# Patient Record
Sex: Female | Born: 1970 | Race: White | Hispanic: No | Marital: Married | Smoking: Never smoker
Health system: Southern US, Community
[De-identification: ages and names within clinical notes are randomized; demographics above are authoritative.]

## PROBLEM LIST (undated history)

## (undated) DIAGNOSIS — C4490 Unspecified malignant neoplasm of skin, unspecified: Secondary | ICD-10-CM

## (undated) DIAGNOSIS — Z9109 Other allergy status, other than to drugs and biological substances: Secondary | ICD-10-CM

## (undated) HISTORY — DX: Unspecified malignant neoplasm of skin, unspecified: C44.90

## (undated) HISTORY — DX: Other allergy status, other than to drugs and biological substances: Z91.09

## (undated) HISTORY — PX: TUBAL LIGATION: SHX77

## (undated) HISTORY — PX: ABDOMINAL HYSTERECTOMY: SHX81

---

## 2006-01-22 ENCOUNTER — Emergency Department: Payer: Self-pay | Admitting: Emergency Medicine

## 2009-12-17 ENCOUNTER — Ambulatory Visit: Payer: Self-pay | Admitting: Family Medicine

## 2011-05-31 ENCOUNTER — Ambulatory Visit: Payer: Self-pay | Admitting: Family Medicine

## 2012-08-20 ENCOUNTER — Emergency Department: Payer: Self-pay | Admitting: Emergency Medicine

## 2012-08-22 ENCOUNTER — Ambulatory Visit: Payer: Self-pay | Admitting: Family Medicine

## 2012-12-25 ENCOUNTER — Encounter: Payer: Self-pay | Admitting: *Deleted

## 2013-01-03 ENCOUNTER — Encounter: Payer: Self-pay | Admitting: Obstetrics & Gynecology

## 2013-01-24 ENCOUNTER — Encounter: Payer: Self-pay | Admitting: Obstetrics & Gynecology

## 2013-01-24 ENCOUNTER — Ambulatory Visit (INDEPENDENT_AMBULATORY_CARE_PROVIDER_SITE_OTHER): Payer: BC Managed Care – PPO | Admitting: Obstetrics & Gynecology

## 2013-01-24 VITALS — BP 120/70 | Ht 62.0 in | Wt 197.0 lb

## 2013-01-24 DIAGNOSIS — N920 Excessive and frequent menstruation with regular cycle: Secondary | ICD-10-CM | POA: Insufficient documentation

## 2013-01-24 DIAGNOSIS — N946 Dysmenorrhea, unspecified: Secondary | ICD-10-CM

## 2013-01-24 MED ORDER — MEGESTROL ACETATE 40 MG PO TABS: 40.0000 mg | ORAL_TABLET | Freq: Every day | ORAL | Status: DC

## 2013-01-24 NOTE — Patient Instructions (Signed)
Endometrial Ablation Endometrial ablation removes the lining of the uterus (endometrium). It is usually a same day, outpatient treatment. Ablation helps avoid major surgery (such as a hysterectomy). A hysterectomy is removal of the cervix and uterus. Endometrial ablation has less risk and complications, has a shorter recovery period and is less expensive. After endometrial ablation, most women will have little or no menstrual bleeding. You may not keep your fertility. Pregnancy is no longer likely after this procedure but if you are pre-menopausal, you still need to use a reliable method of birth control following the procedure because pregnancy can occur. REASONS TO HAVE THE PROCEDURE MAY INCLUDE:  Heavy periods.  Bleeding that is causing anemia.  Anovulatory bleeding, very irregular, bleeding.  Bleeding submucous fibroids (on the lining inside the uterus) if they are smaller than 3 centimeters. REASONS NOT TO HAVE THE PROCEDURE MAY INCLUDE:  You wish to have more children.  You have a pre-cancerous or cancerous problem. The cause of any abnormal bleeding must be diagnosed before having the procedure.  You have pain coming from the uterus.  You have a submucus fibroid larger than 3 centimeters.  You recently had a baby.  You recently had an infection in the uterus.  You have a severe retro-flexed, tipped uterus and cannot insert the instrument to do the ablation.  You had a Cesarean section or deep major surgery on the uterus.  The inner cavity of the uterus is too large for the endometrial ablation instrument. RISKS AND COMPLICATIONS   Perforation of the uterus.  Bleeding.  Infection of the uterus, bladder or vagina.  Injury to surrounding organs.  Cutting the cervix.  An air bubble to the lung (air embolus).  Pregnancy following the procedure.  Failure of the procedure to help the problem requiring hysterectomy.  Decreased ability to diagnose cancer in the lining of  the uterus. BEFORE THE PROCEDURE  The lining of the uterus must be tested to make sure there is no pre-cancerous or cancer cells present.  Medications may be given to make the lining of the uterus thinner.  Ultrasound may be used to evaluate the size and look for abnormalities of the uterus.  Future pregnancy is not desired. PROCEDURE  There are different ways to destroy the lining of the uterus.   Resectoscope - radio frequency-alternating electric current is the most common one used.  Cryotherapy - freezing the lining of the uterus.  Heated Free Liquid - heated salt (saline) solution inserted into the uterus.  Microwave - uses high energy microwaves in the uterus.  Thermal Balloon - a catheter with a balloon tip is inserted into the uterus and filled with heated fluid. Your caregiver will talk with you about the method used in this clinic. They will also instruct you on the pros and cons of the procedure. Endometrial ablation is performed along with a procedure called operative hysteroscopy. A narrow viewing tube is inserted through the birth canal (vagina) and through the cervix into the uterus. A tiny camera attached to the viewing tube (hysteroscope) allows the uterine cavity to be shown on a TV monitor during surgery. Your uterus is filled with a harmless liquid to make the procedure easier. The lining of the uterus is then removed. The lining can also be removed with a resectoscope which allows your surgeon to cut away the lining of the uterus under direct vision. Usually, you will be able to go home within an hour after the procedure. HOME CARE INSTRUCTIONS   Do   not drive for 24 hours.  No tampons, douching or intercourse for 2 weeks or until your caregiver approves.  Rest at home for 24 to 48 hours. You may then resume normal activities unless told differently by your caregiver.  Take your temperature two times a day for 4 days, and record it.  Take any medications your  caregiver has ordered, as directed.  Use some form of contraception if you are pre-menopausal and do not want to get pregnant. Bleeding after the procedure is normal. It varies from light spotting and mildly watery to bloody discharge for 4 to 6 weeks. You may also have mild cramping. Only take over-the-counter or prescription medicines for pain, discomfort, or fever as directed by your caregiver. Do not use aspirin, as this may aggravate bleeding. Frequent urination during the first 24 hours is normal. You will not know how effective your surgery is until at least 3 months after the surgery. SEEK IMMEDIATE MEDICAL CARE IF:   Bleeding is heavier than a normal menstrual cycle.  An oral temperature above 102 F (38.9 C) develops.  You have increasing cramps or pains not relieved with medication or develop belly (abdominal) pain which does not seem to be related to the same area of earlier cramping and pain.  You are light headed, weak or have fainting episodes.  You develop pain in the shoulder strap areas.  You have chest or leg pain.  You have abnormal vaginal discharge.  You have painful urination. Document Released: 04/08/2004 Document Revised: 08/22/2011 Document Reviewed: 07/07/2007 [employer] Patient Information 2014 [employer].  

## 2013-01-24 NOTE — Progress Notes (Signed)
Patient ID: Frances Weber, female   DOB: 1970-08-23, 42 y.o.   MRN: [id number] Farrell is referred for evaluation of small cystic areas on the outside of the vulva small cystic areas in the outside of the vulva On exam they are very small sebaceous cyst of the absolutely no clinical significance  Para the patient does relate that she has extremely heavy and crampy menstrual periods with a lot of cramping and large clotting the first 3 days of her period and she would like that addressed  I told her that we'll put her in May see her back in about 5 weeks or so and do a sonogram to evaluate the response of her endometrium and her bleeding since then and probably proceed with endometrial ablation She is given information regarding that and will see her back in about 5-6 weeks

## 2013-03-07 ENCOUNTER — Ambulatory Visit (INDEPENDENT_AMBULATORY_CARE_PROVIDER_SITE_OTHER): Payer: BC Managed Care – PPO

## 2013-03-07 ENCOUNTER — Encounter: Payer: Self-pay | Admitting: Obstetrics & Gynecology

## 2013-03-07 ENCOUNTER — Ambulatory Visit (INDEPENDENT_AMBULATORY_CARE_PROVIDER_SITE_OTHER): Payer: BC Managed Care – PPO | Admitting: Obstetrics & Gynecology

## 2013-03-07 VITALS — BP 130/84 | Ht 63.0 in | Wt 189.5 lb

## 2013-03-07 DIAGNOSIS — N920 Excessive and frequent menstruation with regular cycle: Secondary | ICD-10-CM

## 2013-03-07 DIAGNOSIS — N946 Dysmenorrhea, unspecified: Secondary | ICD-10-CM

## 2013-03-07 MED ORDER — MEGESTROL ACETATE 40 MG PO TABS: 40.0000 mg | ORAL_TABLET | Freq: Every day | ORAL | Status: DC

## 2013-03-07 NOTE — Progress Notes (Signed)
Patient ID: Frances Weber, female   DOB: December 07, 1970, 42 y.o.   MRN: [id number] Preoperative History and Physical  [name] is a 42 y.o. G3P3 with Patient's last menstrual period was 01/06/2013. admitted for a heavy vaginal bleeding and painful periods.  Sonogrm is normal and positive response to megestrol, suitable candidate for megestrol  PMH:    Past Medical History  Diagnosis Date  . Skin cancer     PSH:     Past Surgical History  Procedure Laterality Date  . Tubal ligation      POb/GynH:      OB History   Grav Para Term Preterm Abortions TAB SAB Ect Mult Living   3 3        3       SH:   History  Substance Use Topics  . Smoking status: Never Smoker   . Smokeless tobacco: Never Used  . Alcohol Use: Yes     Comment: occ    FH:    Family History  Problem Relation Age of Onset  . Diabetes Mother   . Hyperlipidemia Mother   . Cancer Mother   . Diabetes Father   . Hypertension Father      Allergies: No Known Allergies  Medications:      Current outpatient prescriptions:megestrol (MEGACE) 40 MG tablet, Take 1 tablet (40 mg total) by mouth daily., Disp: 30 tablet, Rfl: 2;  ferrous sulfate 325 (65 FE) MG tablet, Take 325 mg by mouth daily with breakfast., Disp: , Rfl: ;  fluticasone (FLONASE) 50 MCG/ACT nasal spray, Place 1 spray into the nose daily., Disp: , Rfl:  guaiFENesin-codeine (CHERATUSSIN AC) 100-10 MG/5ML syrup, Take 5 mLs by mouth 3 (three) times daily as needed for cough., Disp: , Rfl: ;  loratadine (CLARITIN) 10 MG tablet, Take 10 mg by mouth daily., Disp: , Rfl:   Review of Systems:   Review of Systems  Constitutional: Negative for fever, chills, weight loss, malaise/fatigue and diaphoresis.  HENT: Negative for hearing loss, ear pain, nosebleeds, congestion, sore throat, neck pain, tinnitus and ear discharge.   Eyes: Negative for blurred vision, double vision, photophobia, pain, discharge and redness.  Respiratory: Negative for cough, hemoptysis,  sputum production, shortness of breath, wheezing and stridor.   Cardiovascular: Negative for chest pain, palpitations, orthopnea, claudication, leg swelling and PND.  Gastrointestinal: Positive for abdominal pain. Negative for heartburn, nausea, vomiting, diarrhea, constipation, blood in stool and melena.  Genitourinary: Negative for dysuria, urgency, frequency, hematuria and flank pain.  Musculoskeletal: Negative for myalgias, back pain, joint pain and falls.  Skin: Negative for itching and rash.  Neurological: Negative for dizziness, tingling, tremors, sensory change, speech change, focal weakness, seizures, loss of consciousness, weakness and headaches.  Endo/Heme/Allergies: Negative for environmental allergies and polydipsia. Does not bruise/bleed easily.  Psychiatric/Behavioral: Negative for depression, suicidal ideas, hallucinations, memory loss and substance abuse. The patient is not nervous/anxious and does not have insomnia.      PHYSICAL EXAM:  Blood pressure 130/84, height 5\' 3"  (1.6 m), weight 189 lb 8 oz (85.957 kg), last menstrual period 01/06/2013.    Vitals reviewed. Constitutional: She is oriented to person, place, and time. She appears well-developed and well-nourished.  HENT:  Head: Normocephalic and atraumatic.  Right Ear: External ear normal.  Left Ear: External ear normal.  Nose: Nose normal.  Mouth/Throat: Oropharynx is clear and moist.  Eyes: Conjunctivae and EOM are normal. Pupils are equal, round, and reactive to light. Right eye exhibits no discharge. Left  eye exhibits no discharge. No scleral icterus.  Neck: Normal range of motion. Neck supple. No tracheal deviation present. No thyromegaly present.  Cardiovascular: Normal rate, regular rhythm, normal heart sounds and intact distal pulses.  Exam reveals no gallop and no friction rub.   No murmur heard. Respiratory: Effort normal and breath sounds normal. No respiratory distress. She has no wheezes. She has no  rales. She exhibits no tenderness.  GI: Soft. Bowel sounds are normal. She exhibits no distension and no mass. There is tenderness. There is no rebound and no guarding.  Genitourinary:       Vulva is normal without lesions Vagina is pink moist without discharge Cervix normal in appearance and pap is normal Uterus is normal by sonogram Adnexa is negative with normal sized ovaries by sonogram  Musculoskeletal: Normal range of motion. She exhibits no edema and no tenderness.  Neurological: She is alert and oriented to person, place, and time. She has normal reflexes. She displays normal reflexes. No cranial nerve deficit. She exhibits normal muscle tone. Coordination normal.  Skin: Skin is warm and dry. No rash noted. No erythema. No pallor.  Psychiatric: She has a normal mood and affect. Her behavior is normal. Judgment and thought content normal.    Labs: No results found for this or any previous visit (from the past 336 hour(s)).  EKG: No orders found for this or any previous visit.  Imaging Studies: US Transvaginal Non-ob  04-03-2013   GYNECOLOGIC SONOGRAM   [name] is a 42 y.o. G3P3 LMP 01/06/2013 for a pelvic sonogram for  menorrhagia and pt is currently on Megace to control bleeding.  Uterus                      9.2 x 6.5 x 5.2 cm, anteverted  Endometrium          17.3 mm, symmetrical,   Right ovary             2.5 x 1.6 x 1.5 cm,   Left ovary                2.7 x 1.5 x 1.2 cm,   No free fluid or adnexal masses noted within pelvis  Technician Comments:  Endometrium=17.74mm, symmetrical   Chari Weber 04-03-13 3:50 PM  Normal sonogram with expected decidual reaction secondary to megace. Appropriate for ablation  Lazaro Arms April 03, 2013 4:15 PM        Assessment: Patient Active Problem List   Diagnosis Date Noted  . Excessive or frequent menstruation 01/24/2013  . Dysmenorrhea 01/24/2013    Plan: Hysteroscopy uterine curettage endometrial ablation  Mazy Culton  H 04/03/13 4:22 PM

## 2013-03-26 ENCOUNTER — Encounter (HOSPITAL_COMMUNITY): Payer: Self-pay | Admitting: Pharmacy Technician

## 2013-04-05 ENCOUNTER — Encounter (HOSPITAL_COMMUNITY)
Admission: RE | Admit: 2013-04-05 | Discharge: 2013-04-05 | Disposition: A | Discharge status: Home / Self Care | Payer: BC Managed Care – PPO | Source: Ambulatory Visit | Attending: Obstetrics & Gynecology | Admitting: Obstetrics & Gynecology

## 2013-04-05 ENCOUNTER — Encounter (HOSPITAL_COMMUNITY): Payer: Self-pay

## 2013-04-05 DIAGNOSIS — Z01812 Encounter for preprocedural laboratory examination: Secondary | ICD-10-CM | POA: Insufficient documentation

## 2013-04-05 DIAGNOSIS — Z01818 Encounter for other preprocedural examination: Secondary | ICD-10-CM | POA: Insufficient documentation

## 2013-04-05 LAB — URINALYSIS, ROUTINE W REFLEX MICROSCOPIC
Bilirubin Urine: NEGATIVE
Glucose, UA: NEGATIVE mg/dL
Hgb urine dipstick: NEGATIVE
Ketones, ur: NEGATIVE mg/dL
Protein, ur: NEGATIVE mg/dL
Specific Gravity, Urine: >1.03 — ABNORMAL HIGH (ref 1.005–1.030)
pH: 6 (ref 5.0–8.0)

## 2013-04-05 LAB — COMPREHENSIVE METABOLIC PANEL
ALT: 29 U/L (ref 0–35)
Albumin: 3.8 g/dL (ref 3.5–5.2)
Alkaline Phosphatase: 73 U/L (ref 39–117)
GFR calc non Af Amer: 82 mL/min — ABNORMAL LOW (ref >90)
Potassium: 4.4 mEq/L (ref 3.5–5.1)
Sodium: 138 mEq/L (ref 135–145)
Total Protein: 7.5 g/dL (ref 6.0–8.3)

## 2013-04-05 LAB — CBC
Hemoglobin: 11.8 g/dL — ABNORMAL LOW (ref 12.0–15.0)
MCHC: 33.1 g/dL (ref 30.0–36.0)
RDW: 14.9 % (ref 11.5–15.5)
WBC: 8.7 10*3/uL (ref 4.0–10.5)

## 2013-04-05 LAB — URINE MICROSCOPIC-ADD ON

## 2013-04-05 NOTE — Patient Instructions (Signed)
Frances Weber  04/05/2013   Your procedure is scheduled on:  04/10/2013  Report to [facility] at  820  AM.  Call this number if you have problems the morning of surgery: [phone]   Remember:   Do not eat food or drink liquids after midnight.   Take these medicines the morning of surgery with A SIP OF WATER:  none   Do not wear jewelry, make-up or nail polish.  Do not wear lotions, powders, or perfumes.   Do not shave 48 hours prior to surgery. Men may shave face and neck.  Do not bring valuables to the hospital.  [facility] is not responsible  for any belongings or valuables.               Contacts, dentures or bridgework may not be worn into surgery.  Leave suitcase in the car. After surgery it may be brought to your room.  For patients admitted to the hospital, discharge time is determined by your treatment team.               Patients discharged the day of surgery will not be allowed to drive home.  Name and phone number of your driver: family  Special Instructions: Shower using CHG 2 nights before surgery and the night before surgery.  If you shower the day of surgery use CHG.  Use special wash - you have one bottle of CHG for all showers.  You should use approximately 1/3 of the bottle for each shower.   Please read over the following fact sheets that you were given: Pain Booklet, Coughing and Deep Breathing, Surgical Site Infection Prevention, Anesthesia Post-op Instructions and Care and Recovery After Surgery Endometrial Ablation Endometrial ablation removes the lining of the uterus (endometrium). It is usually a same day, outpatient treatment. Ablation helps avoid major surgery (such as a hysterectomy). A hysterectomy is removal of the cervix and uterus. Endometrial ablation has less risk and complications, has a shorter recovery period and is less expensive. After endometrial ablation, most women will have little or no menstrual bleeding. You may not keep  your fertility. Pregnancy is no longer likely after this procedure but if you are pre-menopausal, you still need to use a reliable method of birth control following the procedure because pregnancy can occur. REASONS TO HAVE THE PROCEDURE MAY INCLUDE:  Heavy periods.  Bleeding that is causing anemia.  Anovulatory bleeding, very irregular, bleeding.  Bleeding submucous fibroids (on the lining inside the uterus) if they are smaller than 3 centimeters. REASONS NOT TO HAVE THE PROCEDURE MAY INCLUDE:  You wish to have more children.  You have a pre-cancerous or cancerous problem. The cause of any abnormal bleeding must be diagnosed before having the procedure.  You have pain coming from the uterus.  You have a submucus fibroid larger than 3 centimeters.  You recently had a baby.  You recently had an infection in the uterus.  You have a severe retro-flexed, tipped uterus and cannot insert the instrument to do the ablation.  You had a Cesarean section or deep major surgery on the uterus.  The inner cavity of the uterus is too large for the endometrial ablation instrument. RISKS AND COMPLICATIONS   Perforation of the uterus.  Bleeding.  Infection of the uterus, bladder or vagina.  Injury to surrounding organs.  Cutting the cervix.  An air bubble to the lung (air embolus).  Pregnancy following the procedure.  Failure of the procedure to help the problem requiring hysterectomy.  Decreased ability to diagnose cancer in the lining of the uterus. BEFORE THE PROCEDURE  The lining of the uterus must be tested to make sure there is no pre-cancerous or cancer cells present.  Medications may be given to make the lining of the uterus thinner.  Ultrasound may be used to evaluate the size and look for abnormalities of the uterus.  Future pregnancy is not desired. PROCEDURE  There are different ways to destroy the lining of the uterus.   Resectoscope - radio frequency-alternating  electric current is the most common one used.  Cryotherapy - freezing the lining of the uterus.  Heated Free Liquid - heated salt (saline) solution inserted into the uterus.  Microwave - uses high energy microwaves in the uterus.  Thermal Balloon - a catheter with a balloon tip is inserted into the uterus and filled with heated fluid. Your caregiver will talk with you about the method used in this clinic. They will also instruct you on the pros and cons of the procedure. Endometrial ablation is performed along with a procedure called operative hysteroscopy. A narrow viewing tube is inserted through the birth canal (vagina) and through the cervix into the uterus. A tiny camera attached to the viewing tube (hysteroscope) allows the uterine cavity to be shown on a TV monitor during surgery. Your uterus is filled with a harmless liquid to make the procedure easier. The lining of the uterus is then removed. The lining can also be removed with a resectoscope which allows your surgeon to cut away the lining of the uterus under direct vision. Usually, you will be able to go home within an hour after the procedure. HOME CARE INSTRUCTIONS   Do not drive for 24 hours.  No tampons, douching or intercourse for 2 weeks or until your caregiver approves.  Rest at home for 24 to 48 hours. You may then resume normal activities unless told differently by your caregiver.  Take your temperature two times a day for 4 days, and record it.  Take any medications your caregiver has ordered, as directed.  Use some form of contraception if you are pre-menopausal and do not want to get pregnant. Bleeding after the procedure is normal. It varies from light spotting and mildly watery to bloody discharge for 4 to 6 weeks. You may also have mild cramping. Only take over-the-counter or prescription medicines for pain, discomfort, or fever as directed by your caregiver. Do not use aspirin, as this may aggravate bleeding.  Frequent urination during the first 24 hours is normal. You will not know how effective your surgery is until at least 3 months after the surgery. SEEK IMMEDIATE MEDICAL CARE IF:   Bleeding is heavier than a normal menstrual cycle.  An oral temperature above 102 F (38.9 C) develops.  You have increasing cramps or pains not relieved with medication or develop belly (abdominal) pain which does not seem to be related to the same area of earlier cramping and pain.  You are light headed, weak or have fainting episodes.  You develop pain in the shoulder strap areas.  You have chest or leg pain.  You have abnormal vaginal discharge.  You have painful urination. Document Released: 04/08/2004 Document Revised: 08/22/2011 Document Reviewed: 07/07/2007 [facility] Patient Information 2014 [place], [place]. Hysteroscopy Hysteroscopy is a procedure used for looking inside the womb (uterus). It may be done for many different reasons, including:  To evaluate abnormal bleeding, fibroid (benign,  noncancerous) tumors, polyps, scar tissue (adhesions), and possibly cancer of the uterus.  To look for lumps (tumors) and other uterine growths.  To look for causes of why a woman cannot get pregnant (infertility), causes of recurrent loss of pregnancy (miscarriages), or a lost intrauterine device (IUD).  To perform a sterilization by blocking the fallopian tubes from inside the uterus. A hysteroscopy should be done right after a menstrual period to be sure you are not pregnant. LET YOUR CAREGIVER KNOW ABOUT:   Allergies.  Medicines taken, including herbs, eyedrops, over-the-counter medicines, and creams.  Use of steroids (by mouth or creams).  Previous problems with anesthetics or numbing medicines.  History of bleeding or blood problems.  History of blood clots.  Possibility of pregnancy, if this applies.  Previous surgery.  Other health problems. RISKS AND COMPLICATIONS   Putting a hole in  the uterus.  Excessive bleeding.  Infection.  Damage to the cervix.  Injury to other organs.  Allergic reaction to medicines.  Too much fluid used in the uterus for the procedure. BEFORE THE PROCEDURE   Do not take aspirin or blood thinners for a week before the procedure, or as directed. It can cause bleeding.  Arrive at least 60 minutes before the procedure or as directed to read and sign the necessary forms.  Arrange for someone to take you home after the procedure.  If you smoke, do not smoke for 2 weeks before the procedure. PROCEDURE   Your caregiver may give you medicine to relax you. He or she may also give you a medicine that numbs the area around the cervix (local anesthetic) or a medicine that makes you sleep (general anesthesia).  Sometimes, a medicine is placed in the cervix the day before the procedure. This medicine makes the cervix have a larger opening (dilate). This makes it easier for the instrument to be inserted into the uterus.  A small instrument (hysteroscope) is inserted through the vagina into the uterus. This instrument is similar to a pencil-sized telescope with a light.  During the procedure, air or a liquid is put into the uterus, which allows the surgeon to see better.  Sometimes, tissue is gently scraped from inside the uterus. These tissue samples are sent to a specialist who looks at tissue samples (pathologist). The pathologist will give a report to your caregiver. This will help your caregiver decide if further treatment is necessary. The report will also help your caregiver decide on the best treatment if the test comes back abnormal. AFTER THE PROCEDURE   If you had a general anesthetic, you may be groggy for a couple hours after the procedure.  If you had a local anesthetic, you will be advised to rest at the surgical center or caregiver's office until you are stable and feel ready to go home.  You may have some cramping for a couple  days.  You may have bleeding, which varies from light spotting for a few days to menstrual-like bleeding for up to 3 to 7 days. This is normal.  Have someone take you home. FINDING OUT THE RESULTS OF YOUR TEST Not all test results are available during your visit. If your test results are not back during the visit, make an appointment with your caregiver to find out the results. Do not assume everything is normal if you have not heard from your caregiver or the medical facility. It is important for you to follow up on all of your test results. HOME CARE INSTRUCTIONS  Do not drive for 24 hours or as instructed.  Only take over-the-counter or prescription medicines for pain, discomfort, or fever as directed by your caregiver.  Do not take aspirin. It can cause or aggravate bleeding.  Do not drive or drink alcohol while taking pain medicine.  You may resume your usual diet.  Do not use tampons, douche, or have sexual intercourse for 2 weeks, or as advised by your caregiver.  Rest and sleep for the first 24 to 48 hours.  Take your temperature twice a day for 4 to 5 days. Write it down. Give these temperatures to your caregiver if they are abnormal (above 98.6 F or 37.0 C).  Take medicines your caregiver has ordered as directed.  Follow your caregiver's advice regarding diet, exercise, lifting, driving, and general activities.  Take showers instead of baths for 2 weeks, or as recommended by your caregiver.  If you develop constipation:  Take a mild laxative with the advice of your caregiver.  Eat bran foods.  Drink enough water and fluids to keep your urine clear or pale yellow.  Try to have someone with you or available to you for the first 24 to 48 hours, especially if you had a general anesthetic.  Make sure you and your family understand everything about your operation and recovery.  Follow your caregiver's advice regarding follow-up appointments and Pap smears. SEEK  MEDICAL CARE IF:   You feel dizzy or lightheaded.  You feel sick to your stomach (nauseous).  You develop abnormal vaginal discharge.  You develop a rash.  You have an abnormal reaction or allergy to your medicine.  You need stronger pain medicine. SEEK IMMEDIATE MEDICAL CARE IF:   Bleeding is heavier than a normal menstrual period or you have blood clots.  You have an oral temperature above 102 F (38.9 C), not controlled by medicine.  You have increasing cramps or pains not relieved with medicine.  You develop belly (abdominal) pain that does not seem to be related to the same area of earlier cramping and pain.  You pass out.  You develop pain in the tops of your shoulders (shoulder strap areas).  You develop shortness of breath. MAKE SURE YOU:   Understand these instructions.  Will watch your condition.  Will get help right away if you are not doing well or get worse. Document Released: 09/05/2000 Document Revised: 08/22/2011 Document Reviewed: 12/29/2008 [facility] Patient Information 2014 [place], [place]. Dilation and Curettage or Vacuum Curettage Dilation and curettage (D&C) and vacuum curettage are minor procedures. A D&C involves stretching (dilation) the cervix and scraping (curettage) the inside lining of the womb (uterus). During a D&C, tissue is gently scraped from the inside lining of the uterus. During a vacuum curettage, the lining and tissue in the uterus are removed with the use of gentle suction. Curettage may be performed for diagnostic or therapeutic purposes. As a diagnostic procedure, curettage is performed for the purpose of examining tissues from the uterus. Tissue examination may help determine causes or treatment options for symptoms. A diagnostic curettage may be performed for the following symptoms:  Irregular bleeding in the uterus.  Bleeding with the development of clots.  Spotting between menstrual periods.  Prolonged menstrual  periods.  Bleeding after menopause.  No menstrual period (amenorrhea).  A change in size and shape of the uterus. A therapeutic curettage is performed to remove tissue, blood, or a contraceptive device. Therapeutic curettage may be performed for the following conditions:   Removal of an  IUD (intrauterine device).  Removal of retained placenta after giving birth. Retained placenta can cause bleeding severe enough to require transfusions or an infection.  Abortion.  Miscarriage.  Removal of polyps inside the uterus.  Removal of uncommon types of fibroids (noncancerous lumps). LET YOUR CAREGIVER KNOW ABOUT:   Allergies to food or medicine.  Medicines taken, including vitamins, herbs, eyedrops, over-the-counter medicines, and creams.  Use of steroids (by mouth or creams).  Previous problems with anesthetics or numbing medicines.  History of bleeding problems or blood clots.  Previous surgery.  Other health problems, including diabetes and kidney problems.  Possibility of pregnancy, if this applies. RISKS AND COMPLICATIONS   Excessive bleeding.  Infection of the uterus.  Damage to the cervix.  Development of scar tissue (adhesions) inside the uterus, later causing abnormal amounts of menstrual bleeding.  Complications from the general anesthetic, if a general anesthetic is used.  Putting a hole (perforation) in the uterus. This is rare. BEFORE THE PROCEDURE   Eat and drink before the procedure only as directed by your caregiver.  Arrange for someone to take you home. PROCEDURE   This procedure may be done in a hospital, outpatient clinic, or caregiver's office.  You may be given a general anesthetic or a local anesthetic in and around the cervix.  You will lie on your back with your legs in stirrups.  There are two ways in which your cervix can be softened and dilated. These include:  Taking a medicine.  Having thin rods (laminaria) inserted into your  cervix.  A curved tool (curette) will scrape cells from the inside lining of the uterus and will then be removed. This procedure usually takes about 15 to 30 minutes. AFTER THE PROCEDURE   You will rest in the recovery area until you are stable and are ready to go home.  You will need to have someone take you home.  You may feel sick to your stomach (nauseous) or throw up (vomit) if you had general anesthesia.  You may have a sore throat if a tube was placed in your throat during general anesthesia.  You may have light cramping and bleeding for 2 days to 2 weeks after the procedure.  Your uterus needs to make a new lining after the procedure. This may make your next period late. Document Released: 05/30/2005 Document Revised: 08/22/2011 Document Reviewed: 12/26/2008 [facility] Patient Information 2014 [place], [place]. PATIENT INSTRUCTIONS POST-ANESTHESIA  IMMEDIATELY FOLLOWING SURGERY:  Do not drive or operate machinery for the first twenty four hours after surgery.  Do not make any important decisions for twenty four hours after surgery or while taking narcotic pain medications or sedatives.  If you develop intractable nausea and vomiting or a severe headache please notify your doctor immediately.  FOLLOW-UP:  Please make an appointment with your surgeon as instructed. You do not need to follow up with anesthesia unless specifically instructed to do so.  WOUND CARE INSTRUCTIONS (if applicable):  Keep a dry clean dressing on the anesthesia/puncture wound site if there is drainage.  Once the wound has quit draining you may leave it open to air.  Generally you should leave the bandage intact for twenty four hours unless there is drainage.  If the epidural site drains for more than 36-48 hours please call the anesthesia department.  QUESTIONS?:  Please feel free to call your physician or the hospital operator if you have any questions, and they will be happy to assist you.

## 2013-04-06 LAB — URINE CULTURE

## 2013-04-10 ENCOUNTER — Encounter (HOSPITAL_COMMUNITY): Payer: BC Managed Care – PPO | Admitting: Anesthesiology

## 2013-04-10 ENCOUNTER — Encounter (HOSPITAL_COMMUNITY)
Admission: RE | Disposition: A | Discharge status: Home / Self Care | Payer: Self-pay | Source: Ambulatory Visit | Attending: Obstetrics & Gynecology

## 2013-04-10 ENCOUNTER — Ambulatory Visit (HOSPITAL_COMMUNITY): Payer: BC Managed Care – PPO | Admitting: Anesthesiology

## 2013-04-10 ENCOUNTER — Ambulatory Visit (HOSPITAL_COMMUNITY)
Admission: RE | Admit: 2013-04-10 | Discharge: 2013-04-10 | Disposition: A | Discharge status: Home / Self Care | Payer: BC Managed Care – PPO | Source: Ambulatory Visit | Attending: Obstetrics & Gynecology | Admitting: Obstetrics & Gynecology

## 2013-04-10 DIAGNOSIS — N920 Excessive and frequent menstruation with regular cycle: Secondary | ICD-10-CM | POA: Insufficient documentation

## 2013-04-10 DIAGNOSIS — Z9889 Other specified postprocedural states: Secondary | ICD-10-CM

## 2013-04-10 DIAGNOSIS — N946 Dysmenorrhea, unspecified: Secondary | ICD-10-CM

## 2013-04-10 HISTORY — PX: DILITATION & CURRETTAGE/HYSTROSCOPY WITH THERMACHOICE ABLATION: SHX5569

## 2013-04-10 SURGERY — DILATATION & CURETTAGE/HYSTEROSCOPY WITH THERMACHOICE ABLATION
Anesthesia: General | Site: Vagina | Wound class: Clean Contaminated

## 2013-04-10 MED ORDER — LIDOCAINE HCL (PF) 1 % IJ SOLN
INTRAMUSCULAR | Status: AC
  Filled 2013-04-10: qty 5

## 2013-04-10 MED ORDER — KETOROLAC TROMETHAMINE 30 MG/ML IJ SOLN
30.0000 mg | Freq: Once | INTRAMUSCULAR | Status: AC
  Administered 2013-04-10: 30 mg via INTRAVENOUS

## 2013-04-10 MED ORDER — MIDAZOLAM HCL 2 MG/2ML IJ SOLN
INTRAMUSCULAR | Status: AC
  Filled 2013-04-10: qty 2

## 2013-04-10 MED ORDER — ONDANSETRON HCL 4 MG/2ML IJ SOLN
INTRAMUSCULAR | Status: AC
  Filled 2013-04-10: qty 2

## 2013-04-10 MED ORDER — FENTANYL CITRATE 0.05 MG/ML IJ SOLN
25.0000 ug | INTRAMUSCULAR | Status: DC | PRN
  Administered 2013-04-10 (×3): 50 ug via INTRAVENOUS
  Filled 2013-04-10: qty 2

## 2013-04-10 MED ORDER — PROPOFOL 10 MG/ML IV BOLUS
INTRAVENOUS | Status: DC | PRN
  Administered 2013-04-10: 200 mg via INTRAVENOUS

## 2013-04-10 MED ORDER — SODIUM CHLORIDE 0.9 % IR SOLN
Status: DC | PRN
  Administered 2013-04-10: 500 mL

## 2013-04-10 MED ORDER — CEFAZOLIN SODIUM-DEXTROSE 2-3 GM-% IV SOLR
2.0000 g | INTRAVENOUS | Status: AC
  Administered 2013-04-10: 2 g via INTRAVENOUS

## 2013-04-10 MED ORDER — ONDANSETRON HCL 8 MG PO TABS: 8.0000 mg | ORAL_TABLET | Freq: Three times a day (TID) | ORAL | Status: DC | PRN

## 2013-04-10 MED ORDER — MIDAZOLAM HCL 2 MG/2ML IJ SOLN
1.0000 mg | INTRAMUSCULAR | Status: DC | PRN
  Administered 2013-04-10 (×2): 2 mg via INTRAVENOUS

## 2013-04-10 MED ORDER — FENTANYL CITRATE 0.05 MG/ML IJ SOLN
INTRAMUSCULAR | Status: AC
  Filled 2013-04-10: qty 2

## 2013-04-10 MED ORDER — FENTANYL CITRATE 0.05 MG/ML IJ SOLN
INTRAMUSCULAR | Status: AC
  Filled 2013-04-10: qty 5

## 2013-04-10 MED ORDER — FENTANYL CITRATE 0.05 MG/ML IJ SOLN
25.0000 ug | INTRAMUSCULAR | Status: AC
  Administered 2013-04-10 (×2): 25 ug via INTRAVENOUS

## 2013-04-10 MED ORDER — LIDOCAINE HCL 1 % IJ SOLN
INTRAMUSCULAR | Status: DC | PRN
  Administered 2013-04-10: 50 mg via INTRADERMAL

## 2013-04-10 MED ORDER — HYDROCODONE-ACETAMINOPHEN 5-325 MG PO TABS: 1.0000 | ORAL_TABLET | Freq: Four times a day (QID) | ORAL | Status: DC | PRN

## 2013-04-10 MED ORDER — FENTANYL CITRATE 0.05 MG/ML IJ SOLN
INTRAMUSCULAR | Status: DC | PRN
  Administered 2013-04-10 (×2): 25 ug via INTRAVENOUS
  Administered 2013-04-10: 100 ug via INTRAVENOUS

## 2013-04-10 MED ORDER — KETOROLAC TROMETHAMINE 30 MG/ML IJ SOLN
INTRAMUSCULAR | Status: AC
  Filled 2013-04-10: qty 1

## 2013-04-10 MED ORDER — ONDANSETRON HCL 4 MG/2ML IJ SOLN: 4.0000 mg | Freq: Once | INTRAMUSCULAR | Status: DC | PRN

## 2013-04-10 MED ORDER — CEFAZOLIN SODIUM-DEXTROSE 2-3 GM-% IV SOLR
INTRAVENOUS | Status: AC
  Filled 2013-04-10: qty 50

## 2013-04-10 MED ORDER — ONDANSETRON HCL 4 MG/2ML IJ SOLN
4.0000 mg | Freq: Once | INTRAMUSCULAR | Status: AC
  Administered 2013-04-10: 4 mg via INTRAVENOUS

## 2013-04-10 MED ORDER — LACTATED RINGERS IV SOLN
INTRAVENOUS | Status: DC
  Administered 2013-04-10: 10:00:00 via INTRAVENOUS
  Administered 2013-04-10: 1000 mL via INTRAVENOUS

## 2013-04-10 MED ORDER — KETOROLAC TROMETHAMINE 10 MG PO TABS: 10.0000 mg | ORAL_TABLET | Freq: Three times a day (TID) | ORAL | Status: DC | PRN

## 2013-04-10 MED ORDER — DEXTROSE 5 % IV SOLN
INTRAVENOUS | Status: DC | PRN
  Administered 2013-04-10: 500 mL via INTRAVENOUS

## 2013-04-10 MED ORDER — SODIUM CHLORIDE 0.9 % IR SOLN
Status: DC | PRN
  Administered 2013-04-10: 3000 mL

## 2013-04-10 MED ORDER — PROPOFOL 10 MG/ML IV BOLUS
INTRAVENOUS | Status: AC
  Filled 2013-04-10: qty 20

## 2013-04-10 SURGICAL SUPPLY — 27 items
BAG DECANTER FOR FLEXI CONT (MISCELLANEOUS) ×2 IMPLANT
BAG HAMPER (MISCELLANEOUS) ×2 IMPLANT
CATH THERMACHOICE III (CATHETERS) ×2 IMPLANT
CLOTH BEACON ORANGE TIMEOUT ST (SAFETY) ×2 IMPLANT
COVER LIGHT HANDLE STERIS (MISCELLANEOUS) ×4 IMPLANT
COVER MAYO STAND XLG (DRAPE) ×2 IMPLANT
FORMALIN 10 PREFIL 120ML (MISCELLANEOUS) ×2 IMPLANT
GAUZE SPONGE 4X4 16PLY XRAY LF (GAUZE/BANDAGES/DRESSINGS) ×2 IMPLANT
GLOVE BIOGEL PI IND STRL 8 (GLOVE) ×1 IMPLANT
GLOVE BIOGEL PI INDICATOR 8 (GLOVE) ×1
GLOVE ECLIPSE 8.0 STRL XLNG CF (GLOVE) ×2 IMPLANT
GOWN STRL REIN XL XLG (GOWN DISPOSABLE) ×4 IMPLANT
INST SET HYSTEROSCOPY (KITS) ×2 IMPLANT
IV D5W 500ML (IV SOLUTION) ×2 IMPLANT
IV NS IRRIG 3000ML ARTHROMATIC (IV SOLUTION) ×2 IMPLANT
KIT ROOM TURNOVER AP CYSTO (KITS) ×2 IMPLANT
MANIFOLD NEPTUNE II (INSTRUMENTS) ×2 IMPLANT
MARKER SKIN DUAL TIP RULER LAB (MISCELLANEOUS) ×2 IMPLANT
NS IRRIG 1000ML POUR BTL (IV SOLUTION) ×2 IMPLANT
PACK BASIC III (CUSTOM PROCEDURE TRAY) ×1
PACK SRG BSC III STRL LF ECLPS (CUSTOM PROCEDURE TRAY) ×1 IMPLANT
PAD ARMBOARD 7.5X6 YLW CONV (MISCELLANEOUS) ×2 IMPLANT
PAD TELFA 3X4 1S STER (GAUZE/BANDAGES/DRESSINGS) ×2 IMPLANT
SET BASIN LINEN APH (SET/KITS/TRAYS/PACK) ×2 IMPLANT
SET IRRIG Y TYPE TUR BLADDER L (SET/KITS/TRAYS/PACK) ×2 IMPLANT
SHEET LAVH (DRAPES) ×2 IMPLANT
YANKAUER SUCT BULB TIP 10FT TU (MISCELLANEOUS) ×2 IMPLANT

## 2013-04-10 NOTE — Op Note (Signed)
Preoperative diagnosis: Menometrorrhagia                                        Dysmenorrhea   Postoperative diagnoses: Same as above   Procedure: Hysteroscopy, uterine curettage, endometrial ablation  Surgeon: Despina Hidden MD  Anesthesia: Laryngeal mask airway  Findings: The endometrium was normal. There were no fibroid or other abnormalities.  Description of operation: The patient was taken to the operating room and placed in the supine position. She underwent general anesthesia using the laryngeal mask airway. She was placed in the dorsal lithotomy position and prepped and draped in the usual sterile fashion. A Graves speculum was placed and the anterior cervical lip was grasped with a single-tooth tenaculum. The cervix was dilated serially to allow passage of the hysteroscope. Diagnostic hysteroscopy was performed and was found to be normal. A vigorous uterine curettage was then performed and all tissue sent to pathology for evaluation. The ThermaChoice 3 endometrial ablation balloon was then used were 80 cc of D5W was required to maintain a pressure of 190-200 mm of mercury throughout the procedure. Toatl therapy time was 31:35.  All of the equipment worked well throughout the procedure. All of the fluid was returned at the end of the procedure. The patient was awakened from anesthesia and taken to the recovery room in good stable condition all counts were correct. She received 2 g of Ancef and 30 mg of Toradol preoperatively. She will be discharged from the recovery room and followed up in the office in 1- 2 weeks.  Zahari Fazzino H 04/10/2013 11:24 AM

## 2013-04-10 NOTE — Anesthesia Preprocedure Evaluation (Signed)
Anesthesia Evaluation  Patient identified by MRN, date of birth, ID band Patient awake    Reviewed: Allergy & Precautions, H&P , NPO status , Patient's Chart, lab work & pertinent test results  History of Anesthesia Complications Negative for: history of anesthetic complications  Airway Mallampati: II TM Distance: >3 FB     Dental  (+) Teeth Intact   Pulmonary neg pulmonary ROS,  breath sounds clear to auscultation        Cardiovascular negative cardio ROS  Rhythm:Regular Rate:Normal     Neuro/Psych negative psych ROS   GI/Hepatic negative GI ROS,   Endo/Other    Renal/GU      Musculoskeletal   Abdominal   Peds  Hematology   Anesthesia Other Findings   Reproductive/Obstetrics                           Anesthesia Physical Anesthesia Plan  ASA: I  Anesthesia Plan: General   Post-op Pain Management:    Induction: Intravenous  Airway Management Planned: LMA  Additional Equipment:   Intra-op Plan:   Post-operative Plan: Extubation in OR  Informed Consent: I have reviewed the patients History and Physical, chart, labs and discussed the procedure including the risks, benefits and alternatives for the proposed anesthesia with the patient or authorized representative who has indicated his/her understanding and acceptance.     Plan Discussed with:   Anesthesia Plan Comments:         Anesthesia Quick Evaluation

## 2013-04-10 NOTE — Anesthesia Postprocedure Evaluation (Signed)
  Anesthesia Post-op Note  Patient: Frances Weber  Procedure(s) Performed: Procedure(s) with comments: DILATATION & CURETTAGE/HYSTEROSCOPY WITH THERMACHOICE ABLATION (N/A) - D5 80ml in, 80ml out, temperture 87 degree celsious, total therapy time and 35 sec  Patient Location: PACU  Anesthesia Type:General  Level of Consciousness: awake, alert  and oriented  Airway and Oxygen Therapy: Patient Spontanous Breathing and Patient connected to face mask oxygen  Post-op Pain: none  Post-op Assessment: Post-op Vital signs reviewed, Patient's Cardiovascular Status Stable, Respiratory Function Stable, Patent Airway and No signs of Nausea or vomiting  Post-op Vital Signs: Reviewed and stable  Complications: No apparent anesthesia complications

## 2013-04-10 NOTE — H&P (Signed)
Preoperative History and Physical  Frances Weber is a 42 y.o. G3P3 with Patient's last menstrual period was 01/06/2013. admitted for a heavy vaginal bleeding and painful periods.  Sonogrm is normal and positive response to megestrol, suitable candidate for ablation PMH:  Past Medical History   Diagnosis  Date   .  Skin cancer    PSH:  Past Surgical History   Procedure  Laterality  Date   .  Tubal ligation     POb/GynH:  OB History    Grav  Para  Term  Preterm  Abortions  TAB  SAB  Ect  Mult  Living    3  3         3      SH:  History   Substance Use Topics   .  Smoking status:  Never Smoker   .  Smokeless tobacco:  Never Used   .  Alcohol Use:  Yes      Comment: occ   FH:  Family History   Problem  Relation  Age of Onset   .  Diabetes  Mother    .  Hyperlipidemia  Mother    .  Cancer  Mother    .  Diabetes  Father    .  Hypertension  Father    Allergies: No Known Allergies  Medications: Current outpatient prescriptions:megestrol (MEGACE) 40 MG tablet, Take 1 tablet (40 mg total) by mouth daily., Disp: 30 tablet, Rfl: 2; ferrous sulfate 325 (65 FE) MG tablet, Take 325 mg by mouth daily with breakfast., Disp: , Rfl: ; fluticasone (FLONASE) 50 MCG/ACT nasal spray, Place 1 spray into the nose daily., Disp: , Rfl:  guaiFENesin-codeine (CHERATUSSIN AC) 100-10 MG/5ML syrup, Take 5 mLs by mouth 3 (three) times daily as needed for cough., Disp: , Rfl: ; loratadine (CLARITIN) 10 MG tablet, Take 10 mg by mouth daily., Disp: , Rfl:  Review of Systems:  Review of Systems  Constitutional: Negative for fever, chills, weight loss, malaise/fatigue and diaphoresis.  HENT: Negative for hearing loss, ear pain, nosebleeds, congestion, sore throat, neck pain, tinnitus and ear discharge.  Eyes: Negative for blurred vision, double vision, photophobia, pain, discharge and redness.  Respiratory: Negative for cough, hemoptysis, sputum production, shortness of breath, wheezing and stridor.   Cardiovascular: Negative for chest pain, palpitations, orthopnea, claudication, leg swelling and PND.  Gastrointestinal: Positive for abdominal pain. Negative for heartburn, nausea, vomiting, diarrhea, constipation, blood in stool and melena.  Genitourinary: Negative for dysuria, urgency, frequency, hematuria and flank pain.  Musculoskeletal: Negative for myalgias, back pain, joint pain and falls.  Skin: Negative for itching and rash.  Neurological: Negative for dizziness, tingling, tremors, sensory change, speech change, focal weakness, seizures, loss of consciousness, weakness and headaches.  Endo/Heme/Allergies: Negative for environmental allergies and polydipsia. Does not bruise/bleed easily.  Psychiatric/Behavioral: Negative for depression, suicidal ideas, hallucinations, memory loss and substance abuse. The patient is not nervous/anxious and does not have insomnia.  PHYSICAL EXAM:  Blood pressure 130/84, height 5\' 3"  (1.6 m), weight 189 lb 8 oz (85.957 kg), last menstrual period 01/06/2013.  Vitals reviewed.  Constitutional: She is oriented to person, place, and time. She appears well-developed and well-nourished.  HENT:  Head: Normocephalic and atraumatic.  Right Ear: External ear normal.  Left Ear: External ear normal.  Nose: Nose normal.  Mouth/Throat: Oropharynx is clear and moist.  Eyes: Conjunctivae and EOM are normal. Pupils are equal, round, and reactive to light. Right eye exhibits no discharge. Left eye exhibits  no discharge. No scleral icterus.  Neck: Normal range of motion. Neck supple. No tracheal deviation present. No thyromegaly present.  Cardiovascular: Normal rate, regular rhythm, normal heart sounds and intact distal pulses. Exam reveals no gallop and no friction rub.  No murmur heard.  Respiratory: Effort normal and breath sounds normal. No respiratory distress. She has no wheezes. She has no rales. She exhibits no tenderness.  GI: Soft. Bowel sounds are normal. She  exhibits no distension and no mass. There is tenderness. There is no rebound and no guarding.  Genitourinary:  Vulva is normal without lesions Vagina is pink moist without discharge Cervix normal in appearance and pap is normal Uterus is normal by sonogram Adnexa is negative with normal sized ovaries by sonogram  Musculoskeletal: Normal range of motion. She exhibits no edema and no tenderness.  Neurological: She is alert and oriented to person, place, and time. She has normal reflexes. She displays normal reflexes. No cranial nerve deficit. She exhibits normal muscle tone. Coordination normal.  Skin: Skin is warm and dry. No rash noted. No erythema. No pallor.  Psychiatric: She has a normal mood and affect. Her behavior is normal. Judgment and thought content normal.  Labs:  Results for orders placed during the hospital encounter of 04/05/13 (from the past 336 hour(s))  URINE CULTURE   Collection Time    04/05/13  3:06 PM      Result Value Range   Specimen Description URINE, CLEAN CATCH     Special Requests NONE     Culture  Setup Time       Value: 04/05/2013 22:56     Performed at [facility] Count       Value: 4,000 COLONIES/ML     Performed at [facility]   Culture       Value: INSIGNIFICANT GROWTH     Performed at [facility]   Report Status 04/06/2013 FINAL    URINALYSIS, ROUTINE W REFLEX MICROSCOPIC   Collection Time    04/05/13  3:06 PM      Result Value Range   Color, Urine YELLOW  YELLOW   APPearance CLEAR  CLEAR   Specific Gravity, Urine >1.030 (*) 1.005 - 1.030   pH 6.0  5.0 - 8.0   Glucose, UA NEGATIVE  NEGATIVE mg/dL   Hgb urine dipstick NEGATIVE  NEGATIVE   Bilirubin Urine NEGATIVE  NEGATIVE   Ketones, ur NEGATIVE  NEGATIVE mg/dL   Protein, ur NEGATIVE  NEGATIVE mg/dL   Urobilinogen, UA 0.2  0.0 - 1.0 mg/dL   Nitrite NEGATIVE  NEGATIVE   Leukocytes, UA TRACE (*) NEGATIVE  URINE MICROSCOPIC-ADD ON   Collection Time     04/05/13  3:06 PM      Result Value Range   Squamous Epithelial / LPF MANY (*) RARE   WBC, UA 21-50  <3 WBC/hpf   Bacteria, UA MANY (*) RARE  CBC   Collection Time    04/05/13  3:30 PM      Result Value Range   WBC 8.7  4.0 - 10.5 K/uL   RBC 4.21  3.87 - 5.11 MIL/uL   Hemoglobin 11.8 (*) 12.0 - 15.0 g/dL   HCT 16.1 (*) 09.6 - 04.5 %   MCV 84.8  78.0 - 100.0 fL   MCH 28.0  26.0 - 34.0 pg   MCHC 33.1  30.0 - 36.0 g/dL   RDW 40.9  81.1 - 91.4 %   Platelets 419 (*)  150 - 400 K/uL  COMPREHENSIVE METABOLIC PANEL   Collection Time    04/05/13  3:30 PM      Result Value Range   Sodium 138  135 - 145 mEq/L   Potassium 4.4  3.5 - 5.1 mEq/L   Chloride 102  96 - 112 mEq/L   CO2 24  19 - 32 mEq/L   Glucose, Bld 96  70 - 99 mg/dL   BUN 15  6 - 23 mg/dL   Creatinine, Ser 1.61  0.50 - 1.10 mg/dL   Calcium 9.7  8.4 - 09.6 mg/dL   Total Protein 7.5  6.0 - 8.3 g/dL   Albumin 3.8  3.5 - 5.2 g/dL   AST 17  0 - 37 U/L   ALT 29  0 - 35 U/L   Alkaline Phosphatase 73  39 - 117 U/L   Total Bilirubin <0.1 (*) 0.3 - 1.2 mg/dL   GFR calc non Af Amer 82 (*) >90 mL/min   GFR calc Af Amer >90  >90 mL/min  HCG, QUANTITATIVE, PREGNANCY   Collection Time    04/05/13  3:30 PM      Result Value Range   hCG, Beta Chain, Quant, S <1  <5 mIU/mL     EKG:  No orders found for this or any previous visit.  Imaging Studies:  US Transvaginal Non-ob  03/07/2013 GYNECOLOGIC SONOGRAM ZENAYA ULATOWSKI is a 42 y.o. G3P3 LMP 01/06/2013 for a pelvic sonogram for menorrhagia and pt is currently on Megace to control bleeding. Uterus 9.2 x 6.5 x 5.2 cm, anteverted Endometrium 17.3 mm, symmetrical, Right ovary 2.5 x 1.6 x 1.5 cm, Left ovary 2.7 x 1.5 x 1.2 cm, No free fluid or adnexal masses noted within pelvis Technician Comments: Endometrium=17.6mm, symmetrical Chari Manning 03/07/2013 3:50 PM Normal sonogram with expected decidual reaction secondary to megace. Appropriate for ablation Lazaro Arms 03/07/2013 4:15 PM   Assessment:  Patient Active Problem List    Diagnosis  Date Noted   .  Excessive or frequent menstruation  01/24/2013   .  Dysmenorrhea  01/24/2013   Plan:  Hysteroscopy uterine curettage endometrial ablation    Kameryn Davern H 04/10/2013 7:26 AM

## 2013-04-10 NOTE — Transfer of Care (Signed)
Immediate Anesthesia Transfer of Care Note  Patient: Frances Weber  Procedure(s) Performed: Procedure(s) with comments: DILATATION & CURETTAGE/HYSTEROSCOPY WITH THERMACHOICE ABLATION (N/A) - D5 80ml in, 80ml out, temperture 87 degree celsious, total therapy time and 35 sec  Patient Location: PACU  Anesthesia Type:General  Level of Consciousness: awake and alert   Airway & Oxygen Therapy: Patient Spontanous Breathing and Patient connected to face mask oxygen  Post-op Assessment: Report given to PACU RN and Post -op Vital signs reviewed and stable  Post vital signs: Reviewed and stable  Complications: No apparent anesthesia complications

## 2013-04-10 NOTE — Preoperative (Signed)
Beta Blockers   Reason not to administer Beta Blockers:Not Applicable 

## 2013-04-12 ENCOUNTER — Encounter (HOSPITAL_COMMUNITY): Payer: Self-pay | Admitting: Obstetrics & Gynecology

## 2013-04-18 ENCOUNTER — Ambulatory Visit (INDEPENDENT_AMBULATORY_CARE_PROVIDER_SITE_OTHER): Payer: BC Managed Care – PPO | Admitting: Obstetrics & Gynecology

## 2013-04-18 ENCOUNTER — Encounter: Payer: Self-pay | Admitting: Obstetrics & Gynecology

## 2013-04-18 VITALS — BP 110/80 | Wt 191.0 lb

## 2013-04-18 DIAGNOSIS — Z9889 Other specified postprocedural states: Secondary | ICD-10-CM

## 2013-04-18 NOTE — Progress Notes (Signed)
Patient ID: Frances Weber, female   DOB: 02-18-1971, 42 y.o.   MRN: [id number] Post op hysteroscopy curettage endo ablation 80 cc 31:35 TTT No post op complaints Pathology normal Exam Normal post op with some mild tenderness

## 2013-12-02 DIAGNOSIS — Z85828 Personal history of other malignant neoplasm of skin: Secondary | ICD-10-CM | POA: Insufficient documentation

## 2014-04-14 ENCOUNTER — Encounter: Payer: Self-pay | Admitting: Obstetrics & Gynecology

## 2015-09-16 ENCOUNTER — Encounter: Payer: Self-pay | Admitting: Obstetrics and Gynecology

## 2015-09-16 ENCOUNTER — Ambulatory Visit (INDEPENDENT_AMBULATORY_CARE_PROVIDER_SITE_OTHER): Payer: 59 | Admitting: Obstetrics and Gynecology

## 2015-09-16 VITALS — BP 143/84 | HR 84 | Ht 63.0 in | Wt 216.7 lb

## 2015-09-16 DIAGNOSIS — E669 Obesity, unspecified: Secondary | ICD-10-CM | POA: Diagnosis not present

## 2015-09-16 MED ORDER — CYANOCOBALAMIN 1000 MCG/ML IJ SOLN: 1000.0000 ug | Freq: Once | INTRAMUSCULAR | Status: DC

## 2015-09-16 MED ORDER — PHENTERMINE HCL 37.5 MG PO TABS: 37.5000 mg | ORAL_TABLET | Freq: Every day | ORAL | Status: DC

## 2015-09-16 NOTE — Progress Notes (Signed)
Subjective:  Frances Weber is a 45 y.o. G3P3 at Unknown being seen today for weight loss management- initial visit.  Patient reports General ROS: negative and reports previous weight loss attempts: successful, but unable to go to bariatric center now due to cost and work schedule.  Past treatment has included: small frequent feedings,vitamin B-12 injections, appetite suppresant through bariatric clinic The following portions of the patient's history were reviewed and updated as appropriate: allergies, current medications, past family history, past medical history, past social history, past surgical history and problem list.   Objective:   Filed Vitals:   09/16/15 1413  BP: 143/84  Pulse: 84  Height: 5\' 3"  (1.6 m)  Weight: 216 lb 11.2 oz (98.294 kg)    General:  Alert, oriented and cooperative. Patient is in no acute distress.  :   :   :   :   :   :   PE: Well groomed female in no current distress,   Mental Status: Normal mood and affect. Normal behavior. Normal judgment and thought content.   Current BMI: Body mass index is 38.4 kg/(m^2).   Assessment and Plan:  Obesity  1. Obesity (BMI 30-39.9)    Plan: low carb, High protein diet RX for adipex 37.5 mg daily and B12 103mcg.ml monthly, to start now with first injection given at today's visit. Reviewed side-effects common to both medications and expected outcomes. Increase daily water intake to at least 8 bottle a day, every day.  Goal is to reduse weight by 10% by end of three months, and will re-evaluate then.  RTC in 4 weeks for Nurse visit to check weight & BP, and get next B12 injections.    Please refer to After Visit Summary for other counseling recommendations.    Melody [place], CNM   Melody [employer], CNM      Consider the Low Glycemic Index Diet and 6 smaller meals daily .  This boosts your metabolism and regulates your sugars:   Use the protein bar by Atkins because they have lots of fiber in  them  Find the low carb flatbreads, tortillas and pita breads for sandwiches:  Joseph's makes a pita bread and a flat bread , available at [facility] and [employer]; [place] makes a low carb flatbread available at [employer] and HT that is 9 net carbs and 100 cal [place] makes a low carb whole wheat tortilla available at [employer] most grocery stores with 6 net carbs and 210 cal  [place] yogurt can still have a lot of carbs .  Dannon Light N fit has 80 cal and 8 carbs

## 2015-10-14 ENCOUNTER — Ambulatory Visit (INDEPENDENT_AMBULATORY_CARE_PROVIDER_SITE_OTHER): Payer: 59 | Admitting: Obstetrics and Gynecology

## 2015-10-14 VITALS — BP 124/82 | HR 95 | Wt 203.9 lb

## 2015-10-14 DIAGNOSIS — E669 Obesity, unspecified: Secondary | ICD-10-CM | POA: Diagnosis not present

## 2015-10-14 MED ORDER — CYANOCOBALAMIN 1000 MCG/ML IJ SOLN
1000.0000 ug | Freq: Once | INTRAMUSCULAR | Status: AC
  Administered 2015-10-14: 1000 ug via INTRAMUSCULAR

## 2015-10-14 NOTE — Progress Notes (Signed)
Pt is here for wt, bp check, b-12 inj She is doing great with her diet and exercise, denies any s/e   09/16/15 -wt-216lb

## 2015-11-11 ENCOUNTER — Ambulatory Visit (INDEPENDENT_AMBULATORY_CARE_PROVIDER_SITE_OTHER): Payer: 59 | Admitting: Obstetrics and Gynecology

## 2015-11-11 VITALS — BP 126/83 | HR 103 | Wt 198.3 lb

## 2015-11-11 DIAGNOSIS — E669 Obesity, unspecified: Secondary | ICD-10-CM | POA: Diagnosis not present

## 2015-11-11 MED ORDER — CYANOCOBALAMIN 1000 MCG/ML IJ SOLN
1000.0000 ug | Freq: Once | INTRAMUSCULAR | Status: AC
  Administered 2015-11-11: 1000 ug via INTRAMUSCULAR

## 2015-11-11 NOTE — Progress Notes (Signed)
Pt is here for wt, bp check, b-12 inj She denies any s/e, is doing well  10/14/15 wt-203 09/16/15 wt- 216

## 2015-12-09 ENCOUNTER — Ambulatory Visit (INDEPENDENT_AMBULATORY_CARE_PROVIDER_SITE_OTHER): Payer: 59 | Admitting: Obstetrics and Gynecology

## 2015-12-09 VITALS — BP 132/92 | HR 97 | Wt 195.1 lb

## 2015-12-09 DIAGNOSIS — E669 Obesity, unspecified: Secondary | ICD-10-CM | POA: Diagnosis not present

## 2015-12-09 MED ORDER — CYANOCOBALAMIN 1000 MCG/ML IJ SOLN
1000.0000 ug | Freq: Once | INTRAMUSCULAR | Status: AC
  Administered 2015-12-09: 1000 ug via INTRAMUSCULAR

## 2015-12-09 NOTE — Progress Notes (Signed)
Patient ID: Frances Weber, female   DOB: Jul 09, 1970, 45 y.o.   MRN: [id number] Pt presents for weight, B/P, B-12 injection. No side effects of medication-Phentermine, or B-12.  Weight loss of 3 lbs. Encouraged eating healthy and exercise. Pt was to see provider today but was scheduled with nurse. MNS wrote prescription

## 2016-01-20 ENCOUNTER — Ambulatory Visit (INDEPENDENT_AMBULATORY_CARE_PROVIDER_SITE_OTHER): Payer: 59 | Admitting: Obstetrics and Gynecology

## 2016-01-20 ENCOUNTER — Encounter: Payer: Self-pay | Admitting: Obstetrics and Gynecology

## 2016-01-20 VITALS — BP 130/82 | HR 102 | Ht 63.0 in | Wt 185.9 lb

## 2016-01-20 DIAGNOSIS — Z0000 Encounter for general adult medical examination without abnormal findings: Secondary | ICD-10-CM | POA: Diagnosis not present

## 2016-01-20 DIAGNOSIS — E669 Obesity, unspecified: Secondary | ICD-10-CM

## 2016-01-20 DIAGNOSIS — IMO0002 Reserved for concepts with insufficient information to code with codable children: Secondary | ICD-10-CM

## 2016-01-20 MED ORDER — PHENTERMINE HCL 37.5 MG PO TABS: 37.5000 mg | ORAL_TABLET | Freq: Every day | ORAL | 2 refills | Status: DC

## 2016-01-20 NOTE — Progress Notes (Signed)
SUBJECTIVE:  45 y.o. here for follow-up weight loss visit, previously seen 4 weeks ago. Denies any concerns and feels like medication has worked well as she has lost 31 # to date. Has been off of meds x 2 weeks and desires to restart.  Also desires blood type tested to see if she is candidate for liver transplant for her niece.  OBJECTIVE:  BP 130/82   Pulse (!) 102   Ht 5\' 3"  (1.6 m)   Wt 185 lb 14.4 oz (84.3 kg)   LMP 01/06/2016   BMI 32.93 kg/m   Body mass index is 32.93 kg/m. Patient appears well. ASSESSMENT:  Obesity- responding well to weight loss plan PLAN:  To continue with current medications. B12 1012mcg/ml injection given.  RTC in 4 weeks as planned  Will draw type and RH  Melody [place], CNM

## 2016-01-21 ENCOUNTER — Telehealth: Payer: Self-pay | Admitting: Obstetrics and Gynecology

## 2016-01-21 LAB — RH TYPE: RH TYPE: POSITIVE

## 2016-01-21 LAB — ABO

## 2016-01-21 NOTE — Telephone Encounter (Signed)
Patient called with questions about her lab work she had done and her blood type. She would like a call back.Thanks

## 2016-01-21 NOTE — Telephone Encounter (Signed)
Called pt mailed labs to pt

## 2016-02-17 ENCOUNTER — Ambulatory Visit (INDEPENDENT_AMBULATORY_CARE_PROVIDER_SITE_OTHER): Payer: 59 | Admitting: Obstetrics and Gynecology

## 2016-02-17 VITALS — BP 124/85 | HR 101 | Wt 185.3 lb

## 2016-02-17 DIAGNOSIS — E669 Obesity, unspecified: Secondary | ICD-10-CM

## 2016-02-17 MED ORDER — CYANOCOBALAMIN 1000 MCG/ML IJ SOLN
1000.0000 ug | Freq: Once | INTRAMUSCULAR | Status: AC
  Administered 2016-02-17: 1000 ug via INTRAMUSCULAR

## 2016-02-17 NOTE — Progress Notes (Signed)
Patient ID: Frances Weber, female   DOB: 12/08/1970, 45 y.o.   MRN: [id number] Pt presents for weight, B/P, B-12 injection. No side effects of medication-Phentermine, or B-12.  Weight remains stable at 185  lbs. Encouraged eating healthy and exercise.

## 2016-03-16 ENCOUNTER — Ambulatory Visit: Payer: 59

## 2016-03-21 ENCOUNTER — Ambulatory Visit: Payer: 59

## 2016-03-21 ENCOUNTER — Ambulatory Visit (INDEPENDENT_AMBULATORY_CARE_PROVIDER_SITE_OTHER): Payer: 59 | Admitting: Obstetrics and Gynecology

## 2016-03-21 VITALS — BP 128/85 | HR 105 | Wt 179.5 lb

## 2016-03-21 DIAGNOSIS — E669 Obesity, unspecified: Secondary | ICD-10-CM | POA: Diagnosis not present

## 2016-03-21 MED ORDER — CYANOCOBALAMIN 1000 MCG/ML IJ SOLN
1000.0000 ug | Freq: Once | INTRAMUSCULAR | Status: AC
  Administered 2016-03-21: 1000 ug via INTRAMUSCULAR

## 2016-03-21 NOTE — Progress Notes (Signed)
Patient ID: Frances Weber, female   DOB: [birth year], 45 y.o.   MRN: [id number] Pt presents for weight, B/P, B-12 injection. No side effects of medication-Phentermine, or B-12.  Weight loss of 6 lbs. Encouraged eating healthy and exercise.

## 2016-04-18 ENCOUNTER — Ambulatory Visit (INDEPENDENT_AMBULATORY_CARE_PROVIDER_SITE_OTHER): Payer: 59 | Admitting: Obstetrics and Gynecology

## 2016-04-18 VITALS — BP 127/89 | HR 85 | Ht 63.0 in | Wt 173.0 lb

## 2016-04-18 DIAGNOSIS — E669 Obesity, unspecified: Secondary | ICD-10-CM | POA: Diagnosis not present

## 2016-04-18 MED ORDER — CYANOCOBALAMIN 1000 MCG/ML IJ SOLN
1000.0000 ug | Freq: Once | INTRAMUSCULAR | Status: AC
  Administered 2016-04-18: 1000 ug via INTRAMUSCULAR

## 2016-04-18 NOTE — Progress Notes (Signed)
Patient ID: Frances Weber, female   DOB: 12-10-1970, 45 y.o.   MRN: [id number] Pt presents for weight, B/P, B-12 injection. No side effects of medication-Phentermine, or B-12.  Weight loss of 6 lbs. Encouraged eating healthy and exercise.

## 2016-05-25 ENCOUNTER — Encounter: Payer: 59 | Admitting: Obstetrics and Gynecology

## 2016-06-16 ENCOUNTER — Encounter: Payer: 59 | Admitting: Obstetrics and Gynecology

## 2016-07-07 ENCOUNTER — Ambulatory Visit (INDEPENDENT_AMBULATORY_CARE_PROVIDER_SITE_OTHER): Payer: Self-pay | Admitting: Obstetrics and Gynecology

## 2016-07-07 ENCOUNTER — Encounter: Payer: Self-pay | Admitting: Obstetrics and Gynecology

## 2016-07-07 VITALS — BP 138/86 | HR 88 | Ht 63.0 in | Wt 188.0 lb

## 2016-07-07 DIAGNOSIS — E669 Obesity, unspecified: Secondary | ICD-10-CM

## 2016-07-07 MED ORDER — PHENTERMINE HCL 37.5 MG PO TABS: 37.5000 mg | ORAL_TABLET | Freq: Every day | ORAL | 2 refills | Status: DC

## 2016-07-07 MED ORDER — CYANOCOBALAMIN 1000 MCG/ML IJ SOLN: 1000.0000 ug | INTRAMUSCULAR | 1 refills | Status: DC

## 2016-07-07 NOTE — Progress Notes (Signed)
SUBJECTIVE:  46 y.o. here for follow-up weight loss visit, previously seen 4 weeks ago. Denies any concerns and feels like medications worked well in past and lost 26#s. Desires restarting now as she has new insurance.  OBJECTIVE:  BP 138/86   Pulse 88   Ht 5\' 3"  (1.6 m)   Wt 188 lb (85.3 kg)   LMP 05/16/2016   BMI 33.30 kg/m   Body mass index is 33.3 kg/m. Patient appears well. ASSESSMENT:  Obesity- responded well to weight loss plan PLAN:  To restart medications. B12 1050mcg/ml injection given RTC in 4 weeks as planned  Melody [place], CNM

## 2016-08-05 ENCOUNTER — Ambulatory Visit: Payer: Self-pay

## 2016-08-12 ENCOUNTER — Ambulatory Visit (INDEPENDENT_AMBULATORY_CARE_PROVIDER_SITE_OTHER): Payer: BLUE CROSS/BLUE SHIELD | Admitting: Obstetrics and Gynecology

## 2016-08-12 VITALS — BP 127/84 | HR 89 | Ht 63.0 in | Wt 187.2 lb

## 2016-08-12 DIAGNOSIS — E669 Obesity, unspecified: Secondary | ICD-10-CM | POA: Diagnosis not present

## 2016-08-12 MED ORDER — CYANOCOBALAMIN 1000 MCG/ML IJ SOLN
1000.0000 ug | Freq: Once | INTRAMUSCULAR | Status: AC
  Administered 2016-08-12: 1000 ug via INTRAMUSCULAR

## 2016-08-12 NOTE — Progress Notes (Signed)
Patient ID: Frances Weber, female   DOB: 09-Nov-1970, 46 y.o.   MRN: [id number] Pt presents for weight, B/P, B-12 injection. No side effects of medication-Phentermine, or B-12.  Weight loss of 1 lbs. Encouraged eating healthy and exercise.

## 2016-09-05 ENCOUNTER — Ambulatory Visit (INDEPENDENT_AMBULATORY_CARE_PROVIDER_SITE_OTHER): Payer: BLUE CROSS/BLUE SHIELD | Admitting: Obstetrics and Gynecology

## 2016-09-05 VITALS — BP 130/93 | HR 91 | Ht 63.0 in | Wt 185.7 lb

## 2016-09-05 DIAGNOSIS — E669 Obesity, unspecified: Secondary | ICD-10-CM | POA: Diagnosis not present

## 2016-09-05 MED ORDER — CYANOCOBALAMIN 1000 MCG/ML IJ SOLN
1000.0000 ug | Freq: Once | INTRAMUSCULAR | Status: AC
  Administered 2016-09-05: 1000 ug via INTRAMUSCULAR

## 2016-09-05 NOTE — Progress Notes (Signed)
Patient ID: Frances Weber, female   DOB: 25-Apr-1971, 46 y.o.   MRN: [id number] Pt presents for weight, B/P, B-12 injection. No side effects of medication-Phentermine, or B-12.  Weight loss of 3 lbs. Encouraged eating healthy and exercise. B/P 145/98, P-90 right arm; B/P-130/93, P-91, left arm today. Pt denies any s/s elevated blood pressure. To keep a check on this at home and contact office if this persist.

## 2016-10-05 ENCOUNTER — Encounter: Payer: Self-pay | Admitting: Obstetrics and Gynecology

## 2016-10-05 ENCOUNTER — Ambulatory Visit (INDEPENDENT_AMBULATORY_CARE_PROVIDER_SITE_OTHER): Payer: BLUE CROSS/BLUE SHIELD | Admitting: Obstetrics and Gynecology

## 2016-10-05 VITALS — BP 140/84 | HR 96 | Ht 63.0 in | Wt 181.7 lb

## 2016-10-05 DIAGNOSIS — J301 Allergic rhinitis due to pollen: Secondary | ICD-10-CM | POA: Diagnosis not present

## 2016-10-05 DIAGNOSIS — E669 Obesity, unspecified: Secondary | ICD-10-CM

## 2016-10-05 MED ORDER — CYANOCOBALAMIN 1000 MCG/ML IJ SOLN: 1000.0000 ug | INTRAMUSCULAR | 1 refills | Status: DC

## 2016-10-05 MED ORDER — PHENTERMINE HCL 37.5 MG PO TABS: 37.5000 mg | ORAL_TABLET | Freq: Every day | ORAL | 2 refills | Status: DC

## 2016-10-05 NOTE — Progress Notes (Signed)
SUBJECTIVE:  46 y.o. here for follow-up weight loss visit, previously seen 4 weeks ago. Has been off adipex for one week, desires to continue meds.  Denies any concerns  Except worsening sinus symptoms over last month. Occasional sore throat from post-nasal drip. Still taking allergy meds. Denies fever or green drainage.   OBJECTIVE:  BP 140/84   Pulse 96   Ht 5\' 3"  (1.6 m)   Wt 181 lb 11.2 oz (82.4 kg)   LMP 09/21/2016   BMI 32.19 kg/m   Body mass index is 32.19 kg/m. Patient appears well. Ears clear on exam bilaterallly Nasal passages boggy and red with clear drainage noted No lymphadenopathy Throat not red or sore. ASSESSMENT:  Obesity- responding well to weight loss plan Seasonal allergies rhinitis PLAN:  To continue with current medications. B12 1051mcg/ml injection given To add OTC claritin for allergies.  RTC in 4 weeks as planned  Melody [place], CNM

## 2016-11-04 ENCOUNTER — Ambulatory Visit: Payer: BLUE CROSS/BLUE SHIELD

## 2016-11-10 ENCOUNTER — Other Ambulatory Visit: Payer: Self-pay | Admitting: Emergency Medicine

## 2016-11-10 DIAGNOSIS — Z1231 Encounter for screening mammogram for malignant neoplasm of breast: Secondary | ICD-10-CM

## 2016-11-18 ENCOUNTER — Ambulatory Visit: Payer: BLUE CROSS/BLUE SHIELD

## 2016-11-25 ENCOUNTER — Ambulatory Visit (INDEPENDENT_AMBULATORY_CARE_PROVIDER_SITE_OTHER): Payer: BLUE CROSS/BLUE SHIELD | Admitting: Obstetrics and Gynecology

## 2016-11-25 VITALS — BP 129/99 | HR 88 | Temp 98.8°F | Ht 63.0 in | Wt 184.1 lb

## 2016-11-25 DIAGNOSIS — E669 Obesity, unspecified: Secondary | ICD-10-CM | POA: Diagnosis not present

## 2016-11-25 MED ORDER — CYANOCOBALAMIN 1000 MCG/ML IJ SOLN
1000.0000 ug | Freq: Once | INTRAMUSCULAR | Status: AC
  Administered 2016-11-25: 1000 ug via INTRAMUSCULAR

## 2016-11-25 MED ORDER — CEFDINIR 300 MG PO CAPS: 300.0000 mg | ORAL_CAPSULE | Freq: Two times a day (BID) | ORAL | 0 refills | Status: DC

## 2016-11-25 NOTE — Progress Notes (Signed)
Patient ID: Frances Weber, female   DOB: 1971/03/08, 46 y.o.   MRN: [id number] Pt presents for weight, B/P, B-12 injection. No side effects of medication-Phentermine, or B-12.  Weight gain of 3 lbs. Encouraged eating healthy and exercise. Pt has been sick over the past week. Was given Metronidazole 500mg  from PCP of which she completed this am for bacteria noted on a urine swab. Also given Singulair of which she takes daily. Currently pt c/o possible sinus infection. Getting worse with h/a, sinus pressure, drainage and cough. Pt B/P elevated 146/107 (RT arm); 129/99 (LT arm). Pt is taking sudafed and her phentermine. Advised that while she is taking a decongestant, not to take the phentermine because this can cause elevated B/P.

## 2016-12-05 ENCOUNTER — Ambulatory Visit
Admission: RE | Admit: 2016-12-05 | Discharge: 2016-12-05 | Disposition: A | Discharge status: Home / Self Care | Payer: BLUE CROSS/BLUE SHIELD | Source: Ambulatory Visit | Attending: Emergency Medicine | Admitting: Emergency Medicine

## 2016-12-05 DIAGNOSIS — Z1231 Encounter for screening mammogram for malignant neoplasm of breast: Secondary | ICD-10-CM | POA: Insufficient documentation

## 2016-12-23 ENCOUNTER — Ambulatory Visit: Payer: BLUE CROSS/BLUE SHIELD | Admitting: Obstetrics and Gynecology

## 2016-12-30 ENCOUNTER — Ambulatory Visit: Payer: BLUE CROSS/BLUE SHIELD | Admitting: Obstetrics and Gynecology

## 2017-01-12 ENCOUNTER — Ambulatory Visit (INDEPENDENT_AMBULATORY_CARE_PROVIDER_SITE_OTHER): Payer: BLUE CROSS/BLUE SHIELD | Admitting: Obstetrics and Gynecology

## 2017-01-12 VITALS — BP 138/90 | HR 98 | Wt 181.0 lb

## 2017-01-12 DIAGNOSIS — E663 Overweight: Secondary | ICD-10-CM

## 2017-01-12 MED ORDER — CYANOCOBALAMIN 1000 MCG/ML IJ SOLN
1000.0000 ug | Freq: Once | INTRAMUSCULAR | Status: AC
  Administered 2017-01-12: 1000 ug via INTRAMUSCULAR

## 2017-01-12 NOTE — Progress Notes (Signed)
Pt is here for wt, bp check, b-12 inj She is doing well, denies any s/e, will need appointment with Melody for refill on medication  01/12/17 wt- 181.4lb 11/25/16 wt- 184lb

## 2017-02-06 ENCOUNTER — Ambulatory Visit: Payer: BLUE CROSS/BLUE SHIELD | Admitting: Obstetrics and Gynecology

## 2017-02-22 ENCOUNTER — Ambulatory Visit (INDEPENDENT_AMBULATORY_CARE_PROVIDER_SITE_OTHER): Payer: BLUE CROSS/BLUE SHIELD | Admitting: Obstetrics and Gynecology

## 2017-02-22 ENCOUNTER — Encounter: Payer: Self-pay | Admitting: Obstetrics and Gynecology

## 2017-02-22 VITALS — BP 129/84 | HR 82 | Ht 63.0 in | Wt 181.4 lb

## 2017-02-22 DIAGNOSIS — E669 Obesity, unspecified: Secondary | ICD-10-CM | POA: Diagnosis not present

## 2017-02-22 MED ORDER — PHENTERMINE HCL 37.5 MG PO TABS: 37.5000 mg | ORAL_TABLET | Freq: Every day | ORAL | 2 refills | Status: DC

## 2017-02-22 MED ORDER — IBUPROFEN 800 MG PO TABS: 800.0000 mg | ORAL_TABLET | Freq: Three times a day (TID) | ORAL | 1 refills | Status: DC | PRN

## 2017-02-22 NOTE — Progress Notes (Signed)
Subjective:     Patient ID: Frances Weber, female   DOB: 1970-10-03, 46 y.o.   MRN: [id number]  HPI Has been out of the medicine for a month and had dental work. Desires restarting medications and getting back to weight loss. Reports bruising and swelling persistant since filling last week. Keeps her awake and not getting better. Denies fever or drainage.  Review of Systems Negative except stated above in HPI    Objective:   Physical Exam A&Ox4 Well groomed female in distress Oral exam normal except external bruise on left lower cheek. Blood pressure 129/84, pulse 82, height 5\' 3"  (1.6 m), weight 181 lb 6.4 oz (82.3 kg).  Body mass index is 32.13 kg/m.    Assessment:     Obesity Oral pain s/p filling    Plan:     Will restart adipex and B12. Injection given today. RX for motrin 800mg  po q8h as needed, and encouraged to ice cheek after eating.  RTC in 4 weeks for BP/B12/ and weight check.  Melody [place], CNM

## 2017-03-20 ENCOUNTER — Ambulatory Visit: Payer: BLUE CROSS/BLUE SHIELD | Admitting: Obstetrics and Gynecology

## 2017-04-10 ENCOUNTER — Ambulatory Visit: Payer: BLUE CROSS/BLUE SHIELD | Admitting: Obstetrics and Gynecology

## 2017-05-01 ENCOUNTER — Encounter: Payer: Self-pay | Admitting: Obstetrics and Gynecology

## 2017-05-01 ENCOUNTER — Ambulatory Visit (INDEPENDENT_AMBULATORY_CARE_PROVIDER_SITE_OTHER): Payer: BLUE CROSS/BLUE SHIELD | Admitting: Obstetrics and Gynecology

## 2017-05-01 VITALS — BP 137/84 | HR 90 | Wt 187.4 lb

## 2017-05-01 DIAGNOSIS — E663 Overweight: Secondary | ICD-10-CM | POA: Diagnosis not present

## 2017-05-01 MED ORDER — CYANOCOBALAMIN 1000 MCG/ML IJ SOLN
1000.0000 ug | Freq: Once | INTRAMUSCULAR | Status: AC
  Administered 2017-05-01: 1000 ug via INTRAMUSCULAR

## 2017-05-01 NOTE — Progress Notes (Signed)
Pt is here for wt, bp check, b-12 inj She didn't do as well this month, tore some ligaments in her foot and hasnt been able to exercise.  05/01/17 wt- 187.4lb 02/22/17 wt- 181lb

## 2017-05-29 ENCOUNTER — Ambulatory Visit: Payer: BLUE CROSS/BLUE SHIELD | Admitting: Obstetrics and Gynecology

## 2017-07-05 ENCOUNTER — Encounter: Payer: BLUE CROSS/BLUE SHIELD | Admitting: Obstetrics and Gynecology

## 2017-11-17 ENCOUNTER — Other Ambulatory Visit: Payer: Self-pay | Admitting: Emergency Medicine

## 2017-11-17 DIAGNOSIS — Z1231 Encounter for screening mammogram for malignant neoplasm of breast: Secondary | ICD-10-CM

## 2017-12-06 ENCOUNTER — Ambulatory Visit
Admission: RE | Admit: 2017-12-06 | Discharge: 2017-12-06 | Disposition: A | Discharge status: Home / Self Care | Payer: BLUE CROSS/BLUE SHIELD | Source: Ambulatory Visit | Attending: Emergency Medicine | Admitting: Emergency Medicine

## 2017-12-06 DIAGNOSIS — Z1231 Encounter for screening mammogram for malignant neoplasm of breast: Secondary | ICD-10-CM | POA: Diagnosis not present

## 2018-09-30 IMAGING — MG MM DIGITAL SCREENING BILAT W/ CAD
4 series · 4 of 4 positions shown · non-contrast
Comparison: Previous exam(s).

CLINICAL DATA: Screening.

EXAM:
DIGITAL SCREENING BILATERAL MAMMOGRAM WITH CAD

[L MLO]
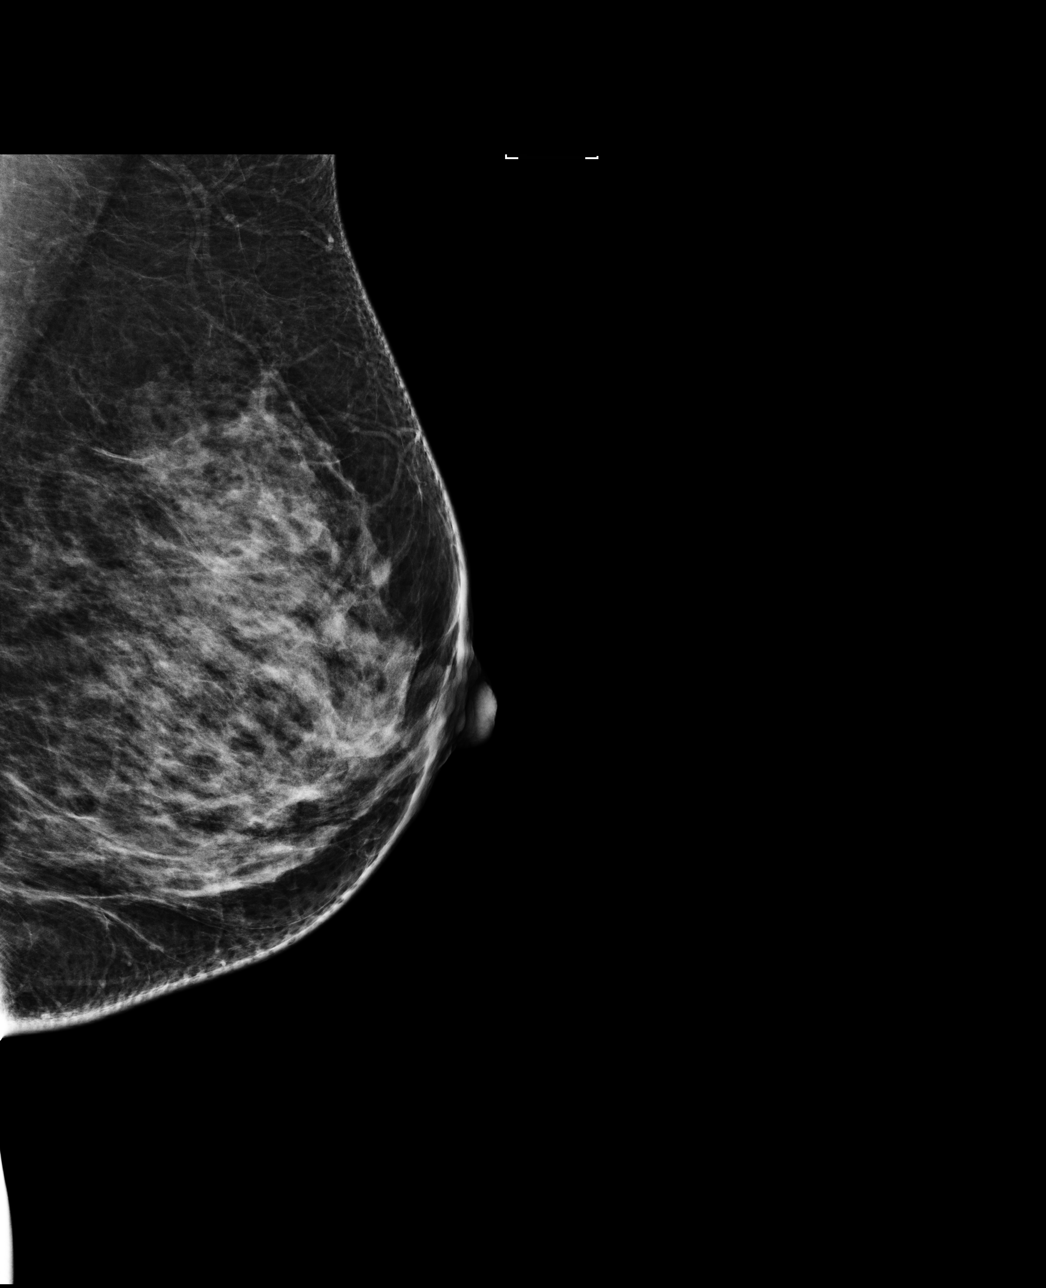

[R MLO]
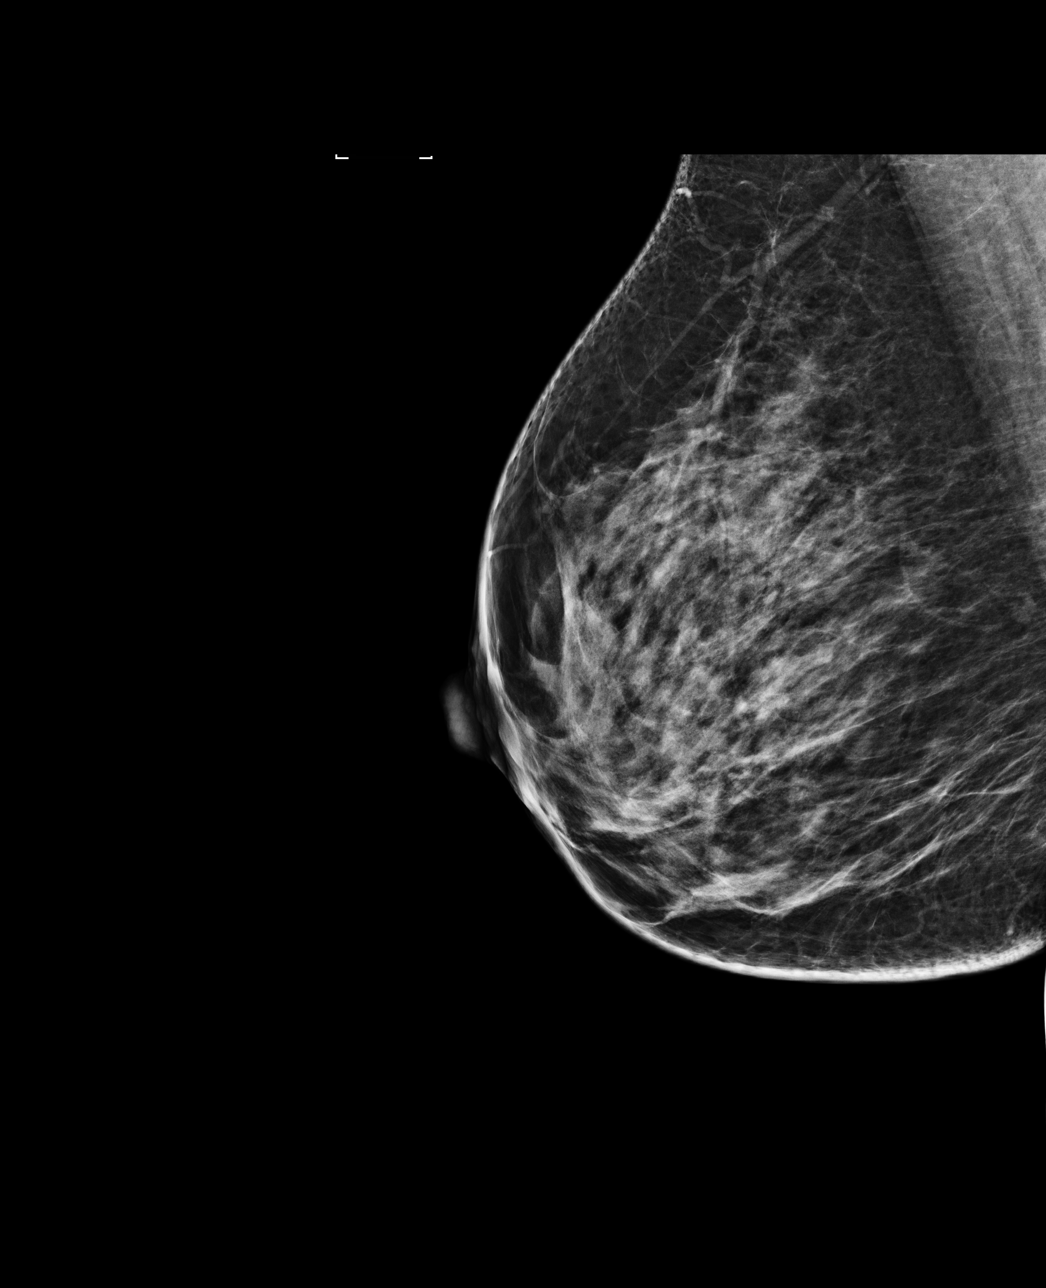

[L CC]
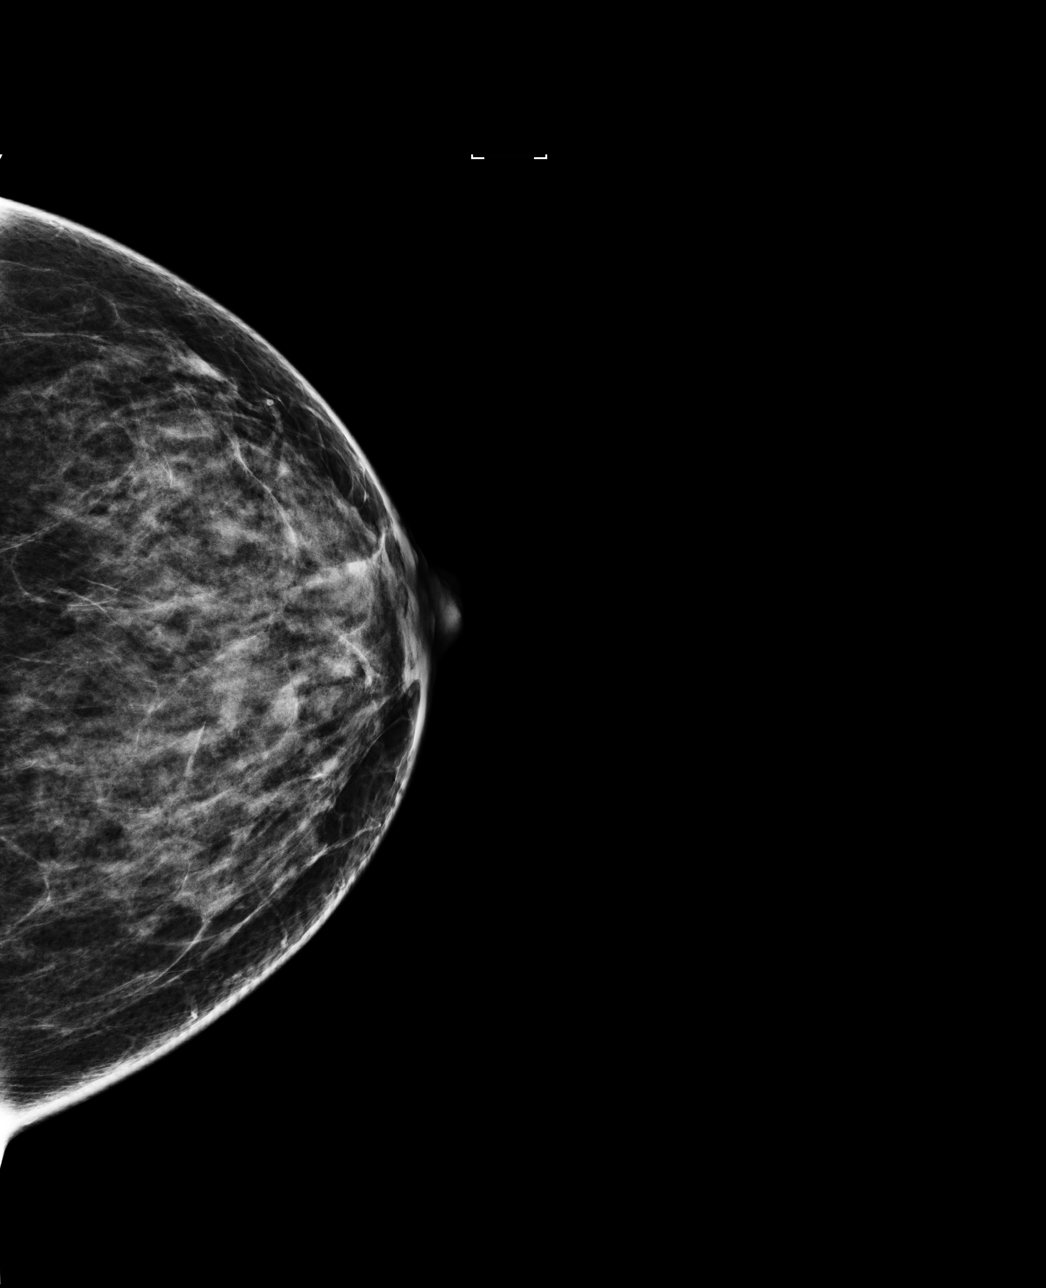

[R CC]
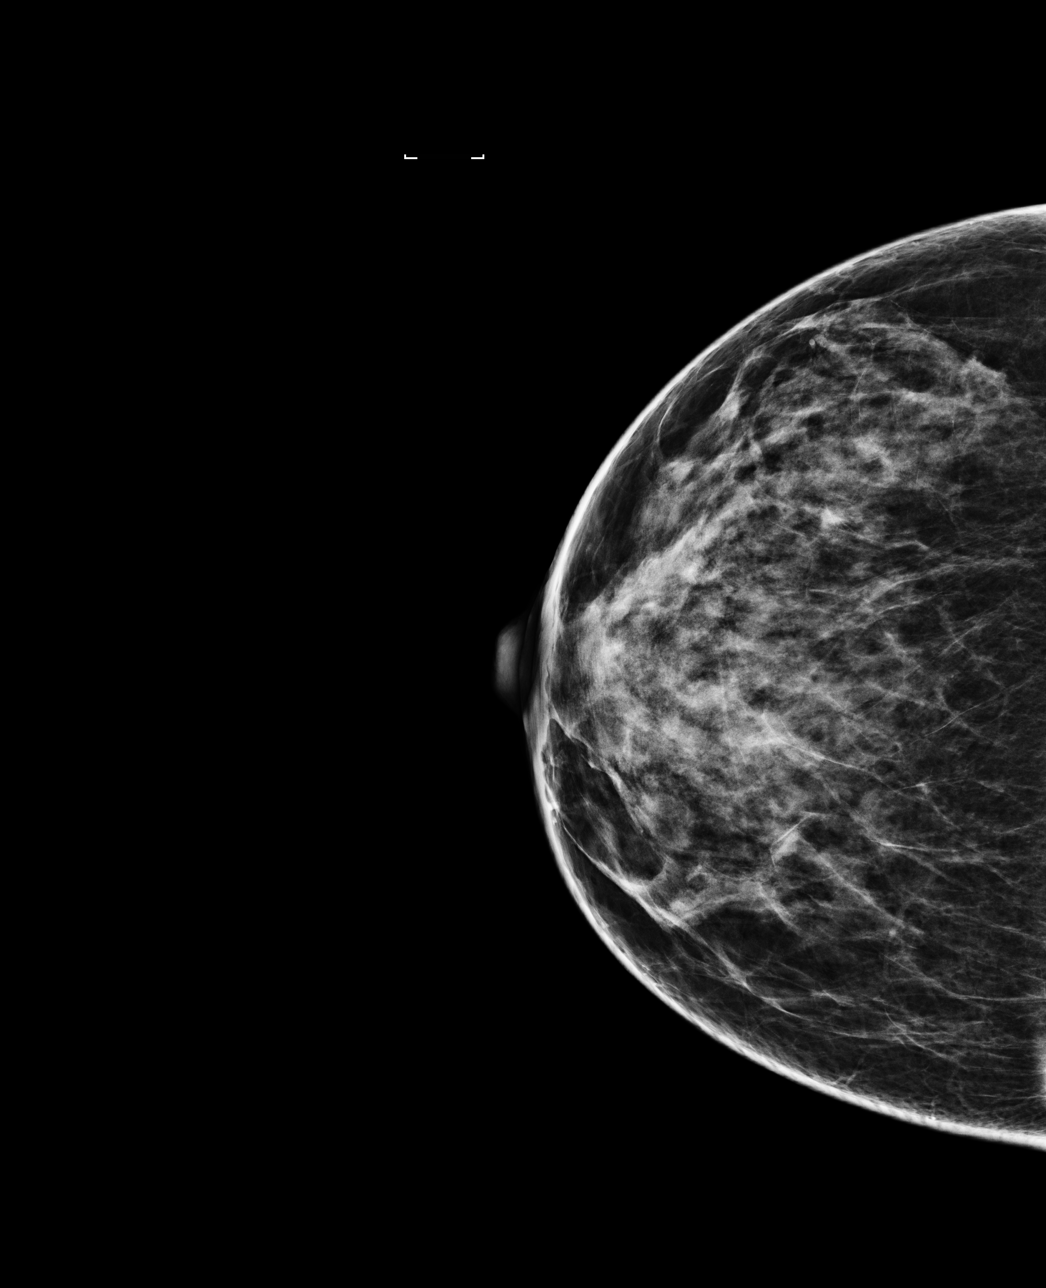

[4 of 4 positions shown; findings below may reference images not displayed]

ACR Breast Density Category c: The breast tissue is heterogeneously
dense, which may obscure small masses.
FINDINGS: There are no findings suspicious for malignancy. Images were
processed with CAD.
IMPRESSION: No mammographic evidence of malignancy. A result letter of this
screening mammogram will be mailed directly to the patient.

RECOMMENDATION:
Screening mammogram in one year. (Code:YJ-[id number])

BI-RADS CATEGORY  1: Negative.

## 2019-01-23 ENCOUNTER — Ambulatory Visit (INDEPENDENT_AMBULATORY_CARE_PROVIDER_SITE_OTHER): Payer: BLUE CROSS/BLUE SHIELD | Admitting: Obstetrics and Gynecology

## 2019-01-23 ENCOUNTER — Other Ambulatory Visit: Payer: Self-pay

## 2019-01-23 ENCOUNTER — Encounter: Payer: Self-pay | Admitting: Obstetrics and Gynecology

## 2019-01-23 VITALS — BP 126/87 | HR 82 | Ht 63.0 in | Wt 215.7 lb

## 2019-01-23 DIAGNOSIS — E669 Obesity, unspecified: Secondary | ICD-10-CM

## 2019-01-23 DIAGNOSIS — Z6838 Body mass index (BMI) 38.0-38.9, adult: Secondary | ICD-10-CM

## 2019-01-23 DIAGNOSIS — Z7689 Persons encountering health services in other specified circumstances: Secondary | ICD-10-CM

## 2019-01-23 DIAGNOSIS — R5383 Other fatigue: Secondary | ICD-10-CM

## 2019-01-23 MED ORDER — CYANOCOBALAMIN 1000 MCG/ML IJ SOLN: 1000.0000 ug | INTRAMUSCULAR | 1 refills | Status: AC

## 2019-01-23 MED ORDER — PHENTERMINE HCL 37.5 MG PO TABS: 37.5000 mg | ORAL_TABLET | Freq: Every day | ORAL | 2 refills | Status: DC

## 2019-01-23 NOTE — Progress Notes (Signed)
Subjective:  Frances Weber is a 48 y.o. G3P3 at Unknown being seen today for weight loss management- initial visit.  Patient reports General ROS: positive for  - fatigue, night sweats and weight gain menses are lighter and shorter and sometimes skips a month.  and reports previous weight loss attempts:were successful with adipex and B12. Desires restarting, and last use was in 2018.   Risk factors include:none Pertinent social history includes:working 10 hour days 5 days a week, little time to exercise,  The following portions of the patient's history were reviewed and updated as appropriate: allergies, current medications, past family history, past medical history, past social history, past surgical history and problem list.   Objective:   Vitals:   01/23/19 1346  BP: 126/87  Pulse: 82  Weight: 215 lb 11.2 oz (97.8 kg)  Height: 5\' 3"  (1.6 m)    General:  Alert, oriented and cooperative. Patient is in no acute distress.  :   :   :   :   :   :   PE: Well groomed female in no current distress,   Mental Status: Normal mood and affect. Normal behavior. Normal judgment and thought content.   Current BMI: Body mass index is 38.21 kg/m.   Assessment and Plan:  Obesity  There are no diagnoses linked to this encounter.  Plan: low carb, High protein diet RX for adipex 37.5 mg daily and B12 103mcg.ml monthly, to start now with first injection given at today's visit. Reviewed side-effects common to both medications and expected outcomes. Increase daily water intake to at least 8 bottle a day, every day.  Goal is to reduse weight by 10% by end of three months, and will re-evaluate then.  RTC in 4 weeks for Nurse visit to check weight & BP, and get next B12 injections. Fatigue Labs obtained- will follow up accordingly.   Please refer to After Visit Summary for other counseling recommendations.    [place], Frances Weber, CNM   Frances Weber, CNM      Consider the Low  Glycemic Index Diet and 6 smaller meals daily .  This boosts your metabolism and regulates your sugars:   Use the protein bar by Atkins because they have lots of fiber in them  Find the low carb flatbreads, tortillas and pita breads for sandwiches:  Joseph's makes a pita bread and a flat bread , available at [facility] and [employer]; [place] makes a low carb flatbread available at [employer] and HT that is 9 net carbs and 100 cal [place] makes a low carb whole wheat tortilla available at [employer] most grocery stores with 6 net carbs and 210 cal  [place] yogurt can still have a lot of carbs .  Dannon Light Weber fit has 80 cal and 8 carbs

## 2019-01-25 ENCOUNTER — Other Ambulatory Visit: Payer: BLUE CROSS/BLUE SHIELD

## 2019-01-25 ENCOUNTER — Other Ambulatory Visit: Payer: Self-pay

## 2019-01-25 DIAGNOSIS — E669 Obesity, unspecified: Secondary | ICD-10-CM

## 2019-01-25 DIAGNOSIS — R5383 Other fatigue: Secondary | ICD-10-CM

## 2019-01-25 DIAGNOSIS — Z7689 Persons encountering health services in other specified circumstances: Secondary | ICD-10-CM

## 2019-01-26 LAB — COMPREHENSIVE METABOLIC PANEL
ALT: 50 IU/L — ABNORMAL HIGH (ref 0–32)
AST: 33 IU/L (ref 0–40)
Albumin/Globulin Ratio: 1.4 (ref 1.2–2.2)
Albumin: 4.4 g/dL (ref 3.8–4.8)
Alkaline Phosphatase: 102 IU/L (ref 39–117)
BUN/Creatinine Ratio: 14 (ref 9–23)
BUN: 11 mg/dL (ref 6–24)
Bilirubin Total: 0.3 mg/dL (ref 0.0–1.2)
CO2: 25 mmol/L (ref 20–29)
Calcium: 9.9 mg/dL (ref 8.7–10.2)
Chloride: 99 mmol/L (ref 96–106)
Creatinine, Ser: 0.81 mg/dL (ref 0.57–1.00)
GFR calc Af Amer: 99 mL/min/{1.73_m2} (ref >59)
GFR calc non Af Amer: 86 mL/min/{1.73_m2} (ref >59)
Globulin, Total: 3.1 g/dL (ref 1.5–4.5)
Glucose: 99 mg/dL (ref 65–99)
Potassium: 4.9 mmol/L (ref 3.5–5.2)
Sodium: 138 mmol/L (ref 134–144)
Total Protein: 7.5 g/dL (ref 6.0–8.5)

## 2019-01-26 LAB — CBC
Hematocrit: 41.8 % (ref 34.0–46.6)
Hemoglobin: 13.7 g/dL (ref 11.1–15.9)
MCH: 28.5 pg (ref 26.6–33.0)
MCHC: 32.8 g/dL (ref 31.5–35.7)
MCV: 87 fL (ref 79–97)
Platelets: 473 10*3/uL — ABNORMAL HIGH (ref 150–450)
RBC: 4.81 x10E6/uL (ref 3.77–5.28)
RDW: 13.1 % (ref 11.7–15.4)
WBC: 7.6 10*3/uL (ref 3.4–10.8)

## 2019-01-26 LAB — VITAMIN B12: Vitamin B-12: 1054 pg/mL (ref 232–1245)

## 2019-01-26 LAB — VITAMIN D 25 HYDROXY (VIT D DEFICIENCY, FRACTURES): Vit D, 25-Hydroxy: 22.7 ng/mL — ABNORMAL LOW (ref 30.0–100.0)

## 2019-01-26 LAB — THYROID PANEL WITH TSH
Free Thyroxine Index: 2.5 (ref 1.2–4.9)
T3 Uptake Ratio: 23 % — ABNORMAL LOW (ref 24–39)
T4, Total: 10.9 ug/dL (ref 4.5–12.0)
TSH: 1.65 u[IU]/mL (ref 0.450–4.500)

## 2019-01-26 LAB — FERRITIN: Ferritin: 35 ng/mL (ref 15–150)

## 2019-01-29 ENCOUNTER — Other Ambulatory Visit: Payer: Self-pay | Admitting: Obstetrics and Gynecology

## 2019-01-29 DIAGNOSIS — E559 Vitamin D deficiency, unspecified: Secondary | ICD-10-CM | POA: Insufficient documentation

## 2019-01-29 MED ORDER — VITAMIN D (ERGOCALCIFEROL) 1.25 MG (50000 UNIT) PO CAPS: 50000.0000 [IU] | ORAL_CAPSULE | ORAL | 1 refills | Status: DC

## 2019-01-30 ENCOUNTER — Other Ambulatory Visit: Payer: Self-pay | Admitting: *Deleted

## 2019-01-30 ENCOUNTER — Telehealth: Payer: Self-pay | Admitting: Obstetrics and Gynecology

## 2019-01-30 MED ORDER — IBUPROFEN 800 MG PO TABS: 800.0000 mg | ORAL_TABLET | Freq: Three times a day (TID) | ORAL | 1 refills | Status: DC | PRN

## 2019-01-30 NOTE — Telephone Encounter (Signed)
The patient called and stated that she needs a refill of her prescription ibuprofen (ADVIL,MOTRIN) 800 MG tablet [3845]. The patient did not disclose any other information, Please advise.

## 2019-01-30 NOTE — Telephone Encounter (Signed)
Done-ac 

## 2019-03-15 ENCOUNTER — Ambulatory Visit (INDEPENDENT_AMBULATORY_CARE_PROVIDER_SITE_OTHER): Payer: BLUE CROSS/BLUE SHIELD | Admitting: Obstetrics and Gynecology

## 2019-03-15 ENCOUNTER — Encounter: Payer: Self-pay | Admitting: Obstetrics and Gynecology

## 2019-03-15 ENCOUNTER — Other Ambulatory Visit (HOSPITAL_COMMUNITY): Admission: RE | Admit: 2019-03-15 | Payer: BLUE CROSS/BLUE SHIELD | Source: Ambulatory Visit

## 2019-03-15 VITALS — BP 123/84 | HR 91 | Ht 63.0 in | Wt 205.7 lb

## 2019-03-15 DIAGNOSIS — Z6836 Body mass index (BMI) 36.0-36.9, adult: Secondary | ICD-10-CM

## 2019-03-15 DIAGNOSIS — Z01419 Encounter for gynecological examination (general) (routine) without abnormal findings: Secondary | ICD-10-CM | POA: Diagnosis not present

## 2019-03-15 DIAGNOSIS — Z23 Encounter for immunization: Secondary | ICD-10-CM | POA: Diagnosis not present

## 2019-03-15 NOTE — Progress Notes (Signed)
Subjective:   Frances Weber is a 48 y.o. G65P3 Caucasian female here for a routine well-woman exam.  No LMP recorded. Patient has had an ablation.    Current complaints: none PCP: none       doesn't desire labs  Social History: Sexual: heterosexual Marital Status: married Living situation: spouse Occupation: unknown occupation Tobacco/alcohol: no tobacco use Illicit drugs: no history of illicit drug use  The following portions of the patient's history were reviewed and updated as appropriate: allergies, current medications, past family history, past medical history, past social history, past surgical history and problem list.  Past Medical History Past Medical History:  Diagnosis Date  . Environmental allergies   . Skin cancer     Past Surgical History Past Surgical History:  Procedure Laterality Date  . DILITATION & CURRETTAGE/HYSTROSCOPY WITH THERMACHOICE ABLATION N/A 04/10/2013   Procedure: DILATATION & CURETTAGE/HYSTEROSCOPY WITH THERMACHOICE ABLATION;  Surgeon: Florian Buff, MD;  Location: [facility];  Service: Gynecology;  Laterality: N/A;  D5 75ml in, 41ml out, temperture 87 degree celsious, total therapy time 51min and 35 sec  . TUBAL LIGATION      Gynecologic History G3P3  No LMP recorded. Patient has had an ablation. Contraception: tubal ligation Last Pap: 2018. Results were: normal Last mammogram: 11/2017. Results were: normal   Obstetric History OB History  Gravida Para Term Preterm AB Living  3 3       3   SAB TAB Ectopic Multiple Live Births               # Outcome Date GA Lbr Len/2nd Weight Sex Delivery Anes PTL Lv  3 Para           2 Para           1 Para             Current Medications Current Outpatient Medications on File Prior to Visit  Medication Sig Dispense Refill  . cyanocobalamin (,VITAMIN B-12,) 1000 MCG/ML injection Inject 1 mL (1,000 mcg total) into the muscle every 30 (thirty) days. 10 mL 1  . ibuprofen (ADVIL) 800 MG tablet Take 1 tablet  (800 mg total) by mouth every 8 (eight) hours as needed. 60 tablet 1  . phentermine (ADIPEX-P) 37.5 MG tablet Take 1 tablet (37.5 mg total) by mouth daily before breakfast. 30 tablet 2  . Vitamin D, Ergocalciferol, (DRISDOL) 1.25 MG (50000 UT) CAPS capsule Take 1 capsule (50,000 Units total) by mouth every 7 (seven) days. 30 capsule 1  . cyanocobalamin (,VITAMIN B-12,) 1000 MCG/ML injection Inject 1 mL (1,000 mcg total) into the muscle every 30 (thirty) days. (Patient not taking: Reported on 01/23/2019) 10 mL 1  . montelukast (SINGULAIR) 10 MG tablet Take 10 mg by mouth at bedtime.     No current facility-administered medications on file prior to visit.     Review of Systems Patient denies any headaches, blurred vision, shortness of breath, chest pain, abdominal pain, problems with bowel movements, urination, or intercourse.  Objective:  BP 123/84   Pulse 91   Ht 5\' 3"  (1.6 m)   Wt 205 lb 11.2 oz (93.3 kg)   BMI 36.44 kg/m  Physical Exam  General:  Well developed, well nourished, no acute distress. She is alert and oriented x3. Skin:  Warm and dry Neck:  Midline trachea, no thyromegaly or nodules Cardiovascular: Regular rate and rhythm, no murmur heard Lungs:  Effort normal, all lung fields clear to auscultation bilaterally Breasts:  No dominant  palpable mass, retraction, or nipple discharge Abdomen:  Soft, non tender, no hepatosplenomegaly or masses Pelvic:  External genitalia is normal in appearance.  The vagina is normal in appearance. The cervix is bulbous, no CMT.  Thin prep pap is done with HR HPV cotesting. Uterus is felt to be normal size, shape, and contour.  No adnexal masses or tenderness noted. Extremities:  No swelling or varicosities noted Psych:  She has a normal mood and affect  Assessment:   Healthy well-woman exam Needs flu vaccine BMI 36   Plan:  Flu vaccine given. b12 given today and will continue with weight loss meds. Congratulated on 10# wt loss. F/U 1  year for AE, or sooner if needed Mammogram ordered   Olanrewaju Osborn Rockney Ghee, CNM

## 2019-03-21 ENCOUNTER — Encounter: Payer: BLUE CROSS/BLUE SHIELD | Admitting: Obstetrics and Gynecology

## 2019-03-25 LAB — CYTOLOGY - PAP
Diagnosis: NEGATIVE
High risk HPV: NEGATIVE

## 2019-04-12 ENCOUNTER — Ambulatory Visit (INDEPENDENT_AMBULATORY_CARE_PROVIDER_SITE_OTHER): Payer: BLUE CROSS/BLUE SHIELD | Admitting: Obstetrics and Gynecology

## 2019-04-12 ENCOUNTER — Ambulatory Visit (INDEPENDENT_AMBULATORY_CARE_PROVIDER_SITE_OTHER): Payer: BLUE CROSS/BLUE SHIELD

## 2019-04-12 ENCOUNTER — Encounter: Payer: Self-pay | Admitting: Obstetrics and Gynecology

## 2019-04-12 ENCOUNTER — Ambulatory Visit
Admission: EM | Admit: 2019-04-12 | Discharge: 2019-04-12 | Disposition: A | Discharge status: Home / Self Care | Payer: BLUE CROSS/BLUE SHIELD | Attending: Family Medicine | Admitting: Family Medicine

## 2019-04-12 ENCOUNTER — Other Ambulatory Visit: Payer: Self-pay

## 2019-04-12 ENCOUNTER — Encounter: Payer: Self-pay | Admitting: Emergency Medicine

## 2019-04-12 VITALS — BP 117/85 | HR 104 | Wt 204.4 lb

## 2019-04-12 DIAGNOSIS — Z713 Dietary counseling and surveillance: Secondary | ICD-10-CM | POA: Diagnosis not present

## 2019-04-12 DIAGNOSIS — M25571 Pain in right ankle and joints of right foot: Secondary | ICD-10-CM

## 2019-04-12 DIAGNOSIS — Z7689 Persons encountering health services in other specified circumstances: Secondary | ICD-10-CM

## 2019-04-12 DIAGNOSIS — W101XXA Fall (on)(from) sidewalk curb, initial encounter: Secondary | ICD-10-CM | POA: Diagnosis not present

## 2019-04-12 DIAGNOSIS — Z6836 Body mass index (BMI) 36.0-36.9, adult: Secondary | ICD-10-CM

## 2019-04-12 DIAGNOSIS — S93401A Sprain of unspecified ligament of right ankle, initial encounter: Secondary | ICD-10-CM

## 2019-04-12 MED ORDER — MELOXICAM 15 MG PO TABS: 15.0000 mg | ORAL_TABLET | Freq: Every day | ORAL | 0 refills | Status: AC | PRN

## 2019-04-12 MED ORDER — PHENTERMINE HCL 37.5 MG PO TABS: 37.5000 mg | ORAL_TABLET | Freq: Every day | ORAL | 2 refills | Status: AC

## 2019-04-12 MED ORDER — CYANOCOBALAMIN 1000 MCG/ML IJ SOLN
1000.0000 ug | Freq: Once | INTRAMUSCULAR | Status: AC
  Administered 2019-04-12: 14:00:00 1000 ug via INTRAMUSCULAR

## 2019-04-12 NOTE — ED Notes (Signed)
Patient states that she would prefer an ankle splint instead of the cam boot.  Dr. Lacinda Axon was notified and stated that an ankle splint would be fine.

## 2019-04-12 NOTE — ED Triage Notes (Signed)
Pt c/o right ankle pain and swelling. She states that she twisted her ankle about 2 days ago. She has elevated her foot, iced it and does not seem to be better.

## 2019-04-12 NOTE — ED Provider Notes (Addendum)
[facility]    CSN: [id number] Arrival date & time: 04/12/19  1434  History   Chief Complaint Chief Complaint  Patient presents with  . Ankle Pain    right   HPI  48 year old female presents with an ankle injury.  Patient reports that she injured her ankle 2 days ago.  Patient states that she suffered an inversion injury outside after stepping on an incompleted concrete walkway.  Patient reports pain and swelling around the lateral malleolus.  She has been resting, icing, and elevating without improvement.  Worse with activity and prolonged standing.  No reports of bruising.  Patient rates her pain as patient rates his pain as 7/10 in severity.  No other associated symptoms.  No other complaints.  PMH, Surgical Hx, Family Hx, Social History reviewed and updated as below.  Past Medical History:  Diagnosis Date  . Environmental allergies   . Skin cancer    Patient Active Problem List   Diagnosis Date Noted  . Vitamin D deficiency 01/29/2019  . Personal history of other malignant neoplasm of skin 12/02/2013  . Excessive or frequent menstruation 01/24/2013  . Dysmenorrhea 01/24/2013   Past Surgical History:  Procedure Laterality Date  . DILITATION & CURRETTAGE/HYSTROSCOPY WITH THERMACHOICE ABLATION N/A 04/10/2013   Procedure: DILATATION & CURETTAGE/HYSTEROSCOPY WITH THERMACHOICE ABLATION;  Surgeon: Florian Buff, MD;  Location: [facility];  Service: Gynecology;  Laterality: N/A;  D5 71ml in, 28ml out, temperture 87 degree celsious, total therapy time 22min and 35 sec  . TUBAL LIGATION     OB History    Gravida  3   Para  3   Term      Preterm      AB      Living  3     SAB      TAB      Ectopic      Multiple      Live Births             Home Medications    Prior to Admission medications   Medication Sig Start Date End Date Taking? Authorizing Provider  albuterol (VENTOLIN HFA) 108 (90 Base) MCG/ACT inhaler Inhale into the lungs.   Yes  [provider]  cyanocobalamin (,VITAMIN B-12,) 1000 MCG/ML injection Inject 1 mL (1,000 mcg total) into the muscle every 30 (thirty) days. 01/23/19  Yes Shambley, Melody N, CNM  fluticasone (FLONASE) 50 MCG/ACT nasal spray 2 sprays by Each Nare route Two (2) times a day. 06/25/14  Yes [provider]  phentermine (ADIPEX-P) 37.5 MG tablet Take 1 tablet (37.5 mg total) by mouth daily before breakfast. 04/12/19  Yes Shambley, Melody N, CNM  Vitamin D, Ergocalciferol, (DRISDOL) 1.25 MG (50000 UT) CAPS capsule Take 1 capsule (50,000 Units total) by mouth every 7 (seven) days. 01/29/19  Yes Shambley, Melody N, CNM  meloxicam (MOBIC) 15 MG tablet Take 1 tablet (15 mg total) by mouth daily as needed for pain. 04/12/19   Brin Ruggerio G, DO  montelukast (SINGULAIR) 10 MG tablet Take 10 mg by mouth at bedtime.  04/12/19  [provider]    Family History Family History  Problem Relation Age of Onset  . Diabetes Mother   . Hyperlipidemia Mother   . Cancer Mother   . Skin cancer Mother   . Diabetes Father   . Hypertension Father   . Breast cancer Maternal Grandmother   . Skin cancer Sister     Social History Social History  Tobacco Use  . Smoking status: Never Smoker  . Smokeless tobacco: Never Used  Substance Use Topics  . Alcohol use: Yes    Comment: occ  . Drug use: No     Allergies   Oxycodone-acetaminophen   Review of Systems Review of Systems  Constitutional: Negative.   Musculoskeletal:       Ankle injury/pain.   Physical Exam Triage Vital Signs ED Triage Vitals  Enc Vitals Group     BP 04/12/19 1455 136/89     Pulse Rate 04/12/19 1455 99     Resp 04/12/19 1455 18     Temp 04/12/19 1455 98.2 F (36.8 C)     Temp Source 04/12/19 1455 Oral     SpO2 04/12/19 1455 100 %     Weight 04/12/19 1452 210 lb (95.3 kg)     Height 04/12/19 1452 5\' 3"  (1.6 m)     Head Circumference --      Peak Flow --      Pain Score 04/12/19 1452 7     Pain Loc --       Pain Edu? --      Excl. in [place]? --    Updated Vital Signs BP 136/89 (BP Location: Left Arm)   Pulse 99   Temp 98.2 F (36.8 C) (Oral)   Resp 18   Ht 5\' 3"  (1.6 m)   Wt 95.3 kg   SpO2 100%   BMI 37.20 kg/m   Visual Acuity Right Eye Distance:   Left Eye Distance:   Bilateral Distance:    Right Eye Near:   Left Eye Near:    Bilateral Near:     Physical Exam Vitals signs and nursing note reviewed.  Constitutional:      General: She is not in acute distress.    Appearance: Normal appearance. She is not ill-appearing.  HENT:     Head: Normocephalic and atraumatic.  Eyes:     General:        Right eye: No discharge.        Left eye: No discharge.     Conjunctiva/sclera: Conjunctivae normal.  Pulmonary:     Effort: Pulmonary effort is normal. No respiratory distress.  Musculoskeletal:     Comments: Right ankle -tenderness to palpation over the lateral malleolus.  Mild swelling.  Achilles intact.  No tenderness at the base of the fifth metatarsal.  Skin:    General: Skin is warm.     Findings: No bruising.  Neurological:     Mental Status: She is alert.  Psychiatric:        Mood and Affect: Mood normal.        Behavior: Behavior normal.    UC Treatments / Results  Labs (all labs ordered are listed, but only abnormal results are displayed) Labs Reviewed - No data to display  EKG   Radiology Dg Ankle Complete Right  Result Date: 04/12/2019 CLINICAL DATA:  Acute right ankle pain and swelling after injury 2 days ago. EXAM: RIGHT ANKLE - COMPLETE 3+ VIEW COMPARISON:  None. FINDINGS: There is no evidence of fracture, dislocation, or joint effusion. There is no evidence of arthropathy or other focal bone abnormality. Soft tissues are unremarkable. IMPRESSION: Negative. Electronically Signed   By: Marijo Conception M.D.   On: 04/12/2019 15:26    Procedures Procedures (including critical care time)  Medications Ordered in UC Medications - No data to display   Initial Impression / Assessment and Plan / UC  Course  I have reviewed the triage vital signs and the nursing notes.  Pertinent labs & imaging results that were available during my care of the patient were reviewed by me and considered in my medical decision making (see chart for details).    48 year old female presents with a right ankle sprain.  X-ray negative.  Meloxicam as directed.  Continue rest, ice, elevation.  Placed in brace. Supportive care.   Final Clinical Impressions(s) / UC Diagnoses   Final diagnoses:  Sprain of right ankle, unspecified ligament, initial encounter   Discharge Instructions   None    ED Prescriptions    Medication Sig Dispense Auth. Provider   meloxicam (MOBIC) 15 MG tablet Take 1 tablet (15 mg total) by mouth daily as needed for pain. 30 tablet Coral Spikes, DO     PDMP not reviewed this encounter.    Coral Spikes, [place] 04/12/19 1547

## 2019-04-12 NOTE — Progress Notes (Signed)
Pt presents for  Weight,B/P, B-12 injection. No side effects of medications-Phentermine or B-12. Weight loss .5  lbs. Encourage eating healthy and exercise.

## 2019-05-24 ENCOUNTER — Encounter: Payer: BLUE CROSS/BLUE SHIELD | Admitting: Certified Nurse Midwife

## 2019-06-12 ENCOUNTER — Other Ambulatory Visit: Payer: Self-pay | Admitting: Family Medicine

## 2019-06-12 ENCOUNTER — Other Ambulatory Visit: Payer: Self-pay

## 2019-06-12 NOTE — Telephone Encounter (Signed)
error 

## 2019-06-21 ENCOUNTER — Encounter: Payer: BLUE CROSS/BLUE SHIELD | Admitting: Certified Nurse Midwife

## 2019-07-05 ENCOUNTER — Encounter: Payer: BLUE CROSS/BLUE SHIELD | Admitting: Certified Nurse Midwife

## 2019-07-19 ENCOUNTER — Ambulatory Visit (INDEPENDENT_AMBULATORY_CARE_PROVIDER_SITE_OTHER): Payer: BLUE CROSS/BLUE SHIELD | Admitting: Certified Nurse Midwife

## 2019-07-19 ENCOUNTER — Other Ambulatory Visit: Payer: Self-pay

## 2019-07-19 ENCOUNTER — Encounter: Payer: Self-pay | Admitting: Certified Nurse Midwife

## 2019-07-19 VITALS — BP 139/95 | HR 92 | Ht 63.0 in | Wt 203.6 lb

## 2019-07-19 DIAGNOSIS — Z713 Dietary counseling and surveillance: Secondary | ICD-10-CM

## 2019-07-19 DIAGNOSIS — G43809 Other migraine, not intractable, without status migrainosus: Secondary | ICD-10-CM

## 2019-07-19 DIAGNOSIS — Z6836 Body mass index (BMI) 36.0-36.9, adult: Secondary | ICD-10-CM

## 2019-07-19 DIAGNOSIS — E669 Obesity, unspecified: Secondary | ICD-10-CM

## 2019-07-19 DIAGNOSIS — Z7689 Persons encountering health services in other specified circumstances: Secondary | ICD-10-CM

## 2019-07-19 DIAGNOSIS — D519 Vitamin B12 deficiency anemia, unspecified: Secondary | ICD-10-CM

## 2019-07-19 MED ORDER — IBUPROFEN 800 MG PO TABS: 800.0000 mg | ORAL_TABLET | Freq: Three times a day (TID) | ORAL | 0 refills | Status: AC | PRN

## 2019-07-19 MED ORDER — CYANOCOBALAMIN 1000 MCG/ML IJ SOLN
1000.0000 ug | Freq: Once | INTRAMUSCULAR | Status: AC
  Administered 2019-07-19: 1000 ug via INTRAMUSCULAR

## 2019-07-19 NOTE — Patient Instructions (Signed)

## 2019-07-19 NOTE — Progress Notes (Signed)
SUBJECTIVE:  49 y.o. here for follow-up weight loss visit, previously seen in [place]. Denies any concerns and feels like medication is working. She has gained 3 lbs and her BP is elevated today. She states it is due to work stress. Discussed recommendations for use short term and up to a year if losing weight. She has been on program for 5 months with 3 lb wt gain. Discussed that benefits do not out weigh risks and do not recommend continues use. Information given on use of medication and potential side effects. Pt state she has difficulty exercising due to work schedule. Pt encouraged to follow up with PCP regarding BP . Order placed for neurology due to migraine headache she has had for the past 5-6 yrs. Orders placed for ibuprofen 800 mg to help manage headaches until see by neurology.   OBJECTIVE:  BP (!) 139/95   Pulse 92   Ht 5\' 3"  (1.6 m)   Wt 203 lb 9 oz (92.3 kg)   BMI 36.06 kg/m   Body mass index is 36.06 kg/m. Patient appears well. ASSESSMENT:  Obesity- not responding to paln  PLAN:  Discontinue medication until pt is able to manage diet and exercise needed for weight loss  RTC PRN  Philip Aspen, CNM

## 2019-08-29 NOTE — Progress Notes (Signed)
NEUROLOGY CONSULTATION NOTE  Frances Weber MRN: [id number] DOB: [birth year]  Referring provider: Philip Aspen, CNM Primary care provider: No PCP  Reason for consult:  migraines  HISTORY OF PRESENT ILLNESS: Frances Weber is a 49 year old female who presents for migraines.  History supplemented by ED and referring provider notes.  She has a history of headaches since she was 49 years old.  She was told that she had sinus headaches because she had associated nasal congestion and drainage.  Headaches have been worse since early 2020.  It is a squeezing on top of head and a mild to severe pressure and throbbing headache across her forehead.  It is associated with drowsiness, difficulty concentration, sometimes blurred vision and sensitivity to light and sound.  No associated nausea, vomiting, or unilateral numbness and weakness.  They last 30 minutes to 60 minutes.  They are occurring 3 to 4 times a day.  They have been more severe over the past 2 weeks.  No specific triggers.  Laying down in dark and sleep helps.    She presented to [facility] ED on 07/26/2019 after 3 weeks of progressively worsening headache as well as lightheadedness and left-sided chest pain.  Blood pressure at home was in the 150s/80s-90s.  CT Head showed no acute intracranial abnormality.  Small retention cyst noted in right maxillary sinus but no evidence of sinusitis.  EKG was normal.    Current NSAIDS:  Ibuprofen 800mg  (no longer taking because ineffective) Current analgesics:  none Current triptans:  none Current ergotamine:  none Current anti-emetic:  none Current muscle relaxants:  none Current anti-anxiolytic:  none Current sleep aide:  none Current Antihypertensive medications:  none Current Antidepressant medications:  none Current Anticonvulsant medications:  none Current anti-CGRP:  none Current Vitamins/Herbal/Supplements:  B12; D Current Antihistamines/Decongestants:  Flonase Other therapy:   none Hormone/birth control:  none Other medications:  phentermine  Past NSAIDS:  Ketorolac; Mobic; naproxen Past analgesics:  Goodys; Tylenol, Excedrin Past abortive triptans:  none Past abortive ergotamine:  none Past muscle relaxants:  none Past anti-emetic:  Zofran 8mg  Past antihypertensive medications:  none Past antidepressant medications:  none Past anticonvulsant medications:  none Past anti-CGRP:  none Past vitamins/Herbal/Supplements:  none Past antihistamines/decongestants:  none Other past therapies:  none  Caffeine:  Rarely drinks coffee.  No tea.  Rarely soda Diet:  Six to eight 16 oz bottles of water daily; sometimes skips meals (breakfast) Exercise:  Not routine Depression:  no; Anxiety:  no Other pain:  no Sleep hygiene:  No.  May wake up during the night. Family history of headache:  Unknown Radio broadcast assistant at [place]: Past Medical History:  Diagnosis Date  . Environmental allergies   . Skin cancer     PAST SURGICAL HISTORY: Past Surgical History:  Procedure Laterality Date  . DILITATION & CURRETTAGE/HYSTROSCOPY WITH THERMACHOICE ABLATION N/A 04/10/2013   Procedure: DILATATION & CURETTAGE/HYSTEROSCOPY WITH THERMACHOICE ABLATION;  Surgeon: Florian Buff, MD;  Location: [facility];  Service: Gynecology;  Laterality: N/A;  D5 33ml in, 93ml out, temperture 87 degree celsious, total therapy time 73min and 35 sec  . TUBAL LIGATION      MEDICATIONS: Current Outpatient Medications on File Prior to Visit  Medication Sig Dispense Refill  . albuterol (VENTOLIN HFA) 108 (90 Base) MCG/ACT inhaler Inhale into the lungs.    . cyanocobalamin (,VITAMIN B-12,) 1000 MCG/ML injection Inject 1 mL (1,000 mcg total) into the muscle every 30 (  thirty) days. 10 mL 1  . fluticasone (FLONASE) 50 MCG/ACT nasal spray 2 sprays by Each Nare route Two (2) times a day.    . ibuprofen (ADVIL) 800 MG tablet Take 1 tablet (800 mg total) by mouth every 8  (eight) hours as needed. 30 tablet 0  . meloxicam (MOBIC) 15 MG tablet Take 1 tablet (15 mg total) by mouth daily as needed for pain. (Patient not taking: Reported on 07/19/2019) 30 tablet 0  . phentermine (ADIPEX-P) 37.5 MG tablet Take 1 tablet (37.5 mg total) by mouth daily before breakfast. 30 tablet 2  . Vitamin D, Ergocalciferol, (DRISDOL) 1.25 MG (50000 UT) CAPS capsule Take 1 capsule (50,000 Units total) by mouth every 7 (seven) days. 30 capsule 1  . [DISCONTINUED] montelukast (SINGULAIR) 10 MG tablet Take 10 mg by mouth at bedtime.     No current facility-administered medications on file prior to visit.    ALLERGIES: Allergies  Allergen Reactions  . Oxycodone-Acetaminophen     FAMILY HISTORY: Family History  Problem Relation Age of Onset  . Diabetes Mother   . Hyperlipidemia Mother   . Cancer Mother   . Skin cancer Mother   . Diabetes Father   . Hypertension Father   . Breast cancer Maternal Grandmother   . Skin cancer Sister     SOCIAL HISTORY: Social History   Socioeconomic History  . Marital status: Married    Spouse name: Not on file  . Number of children: Not on file  . Years of education: Not on file  . Highest education level: Not on file  Occupational History  . Not on file  Tobacco Use  . Smoking status: Never Smoker  . Smokeless tobacco: Never Used  Substance and Sexual Activity  . Alcohol use: Yes    Comment: occ  . Drug use: No  . Sexual activity: Not Currently    Birth control/protection: None  Other Topics Concern  . Not on file  Social History Narrative  . Not on file   Social Determinants of Health   Financial Resource Strain:   . Difficulty of Paying Living Expenses:   Food Insecurity:   . Worried About Charity fundraiser in the Last Year:   . Arboriculturist in the Last Year:   Transportation Needs:   . Film/video editor (Medical):   Marland Kitchen Lack of Transportation (Non-Medical):   Physical Activity:   . Days of Exercise per Week:    . Minutes of Exercise per Session:   Stress:   . Feeling of Stress :   Social Connections:   . Frequency of Communication with Friends and Family:   . Frequency of Social Gatherings with Friends and Family:   . Attends Religious Services:   . Active Member of Clubs or Organizations:   . Attends Archivist Meetings:   Marland Kitchen Marital Status:   Intimate Partner Violence:   . Fear of Current or Ex-Partner:   . Emotionally Abused:   Marland Kitchen Physically Abused:   . Sexually Abused:     PHYSICAL EXAM: Blood pressure 131/84, pulse 88, resp. rate 18, weight 202 lb (91.6 kg), SpO2 99 %. General: No acute distress.  Patient appears well-groomed.  Head:  Normocephalic/atraumatic Eyes:  fundi examined but not visualized Neck: supple, no paraspinal tenderness, full range of motion Back: No paraspinal tenderness Heart: regular rate and rhythm Lungs: Clear to auscultation bilaterally. Vascular: No carotid bruits. Neurological Exam: Mental status: alert and oriented to person,  place, and time, recent and remote memory intact, fund of knowledge intact, attention and concentration intact, speech fluent and not dysarthric, language intact. Cranial nerves: CN I: not tested CN II: pupils equal, round and reactive to light, visual fields intact CN III, IV, VI:  full range of motion, no nystagmus, no ptosis CN V: facial sensation intact CN VII: upper and lower face symmetric CN VIII: hearing intact CN IX, X: gag intact, uvula midline CN XI: sternocleidomastoid and trapezius muscles intact CN XII: tongue midline Bulk & Tone: normal, no fasciculations. Motor:  5/5 throughout  Sensation: temperature and vibration sensation intact. Deep Tendon Reflexes:  2+ throughout, toes downgoing.  Finger to nose testing:  Without dysmetria.  Heel to shin:  Without dysmetria.   Gait:  Normal station and stride.  Able to turn and tandem walk. Romberg negative.  IMPRESSION: Chronic daily headaches.  Semiology  seems more consistent with tension-type headaches but cannot rule out migraine.  Not overusing analgesics.  PLAN: 1.  For preventative management, start nortriptyline 10mg  at bedtime.  May increase to 25mg  at bedtime in 4 weeks if needed. 2.  For abortive therapy, rizatriptan 10mg  (if these are in fact migraine) 3.  Limit use of pain relievers to no more than 2 days out of week to prevent risk of rebound or medication-overuse headache. 4.  Keep headache diary 5.  Exercise, hydration, caffeine cessation, sleep hygiene, monitor for and avoid triggers 6.  Consider:  magnesium citrate 400mg  daily, riboflavin 400mg  daily, and coenzyme Q10 100mg  three times daily 7. Follow up 4 months.   Thank you for allowing me to take part in the care of this patient.  Metta Clines, DO

## 2019-09-02 ENCOUNTER — Ambulatory Visit (INDEPENDENT_AMBULATORY_CARE_PROVIDER_SITE_OTHER): Payer: Self-pay | Admitting: Neurology

## 2019-09-02 ENCOUNTER — Other Ambulatory Visit: Payer: Self-pay

## 2019-09-02 ENCOUNTER — Ambulatory Visit: Payer: Self-pay | Attending: Internal Medicine

## 2019-09-02 ENCOUNTER — Encounter: Payer: Self-pay | Admitting: Neurology

## 2019-09-02 VITALS — BP 131/84 | HR 88 | Resp 18 | Wt 202.0 lb

## 2019-09-02 DIAGNOSIS — R519 Headache, unspecified: Secondary | ICD-10-CM

## 2019-09-02 DIAGNOSIS — Z23 Encounter for immunization: Secondary | ICD-10-CM

## 2019-09-02 MED ORDER — NORTRIPTYLINE HCL 10 MG PO CAPS: 10.0000 mg | ORAL_CAPSULE | Freq: Every day | ORAL | 3 refills | Status: AC

## 2019-09-02 MED ORDER — RIZATRIPTAN BENZOATE 10 MG PO TABS: ORAL_TABLET | ORAL | 3 refills | Status: AC

## 2019-09-02 NOTE — Patient Instructions (Signed)
  1.  Start nortriptyline 10mg  at bedtime.  Call in 4 weeks with update and we can adjust dose if needed. 2.  Take rizatriptan 10mg  at earliest onset of headache.  May repeat dose once in 2 hours if needed.  Do not exceed two tablets in 24 hours. 3.  Limit use of pain relievers to no more than 2 days out of the week.  These medications include acetaminophen, ibuprofen, triptans and narcotics.  This will help reduce risk of rebound headaches. 4.  Be aware of common food triggers such as processed sweets, processed foods with nitrites (such as deli meat, hot dogs, sausages), foods with MSG, alcohol (such as wine), chocolate, certain cheeses, certain fruits (dried fruits, bananas, pineapple), vinegar, diet soda. 4.  Avoid caffeine 5.  Routine exercise 6.  Proper sleep hygiene 7.  Stay adequately hydrated with water 8.  Keep a headache diary. 9.  Maintain proper stress management. 10.  Do not skip meals. 11.  Consider supplements:  Magnesium citrate 400mg  to 600mg  daily, riboflavin 400mg , Coenzyme Q 10 100mg  three times daily 12.  Follow up in 4 months.

## 2019-09-02 NOTE — Progress Notes (Signed)
   Covid-19 Vaccination Clinic  Name:  Frances Weber    MRN: [id number] DOB: 1970/07/11  09/02/2019  Frances Weber was observed post Covid-19 immunization for 15 minutes without incident. She was provided with Vaccine Information Sheet and instruction to access the V-Safe system.   Frances Weber was instructed to call 911 with any severe reactions post vaccine: Marland Kitchen Difficulty breathing  . Swelling of face and throat  . A fast heartbeat  . A bad rash all over body  . Dizziness and weakness   Immunizations Administered    Name Date Dose VIS Date Route   Pfizer COVID-19 Vaccine 09/02/2019  2:48 PM 0.3 mL 05/24/2019 Intramuscular   Manufacturer: [place]   Lot: [id number]   [place]: [id number]

## 2019-09-23 ENCOUNTER — Ambulatory Visit: Payer: Self-pay | Attending: Internal Medicine

## 2019-09-23 DIAGNOSIS — Z23 Encounter for immunization: Secondary | ICD-10-CM

## 2019-09-23 NOTE — Progress Notes (Signed)
   Covid-19 Vaccination Clinic  Name:  Frances Weber    MRN: [id number] DOB: 1971/02/03  09/23/2019  [name] was observed post Covid-19 immunization for 15 minutes without incident. She was provided with Vaccine Information Sheet and instruction to access the V-Safe system.   [name] was instructed to call 911 with any severe reactions post vaccine: Marland Kitchen Difficulty breathing  . Swelling of face and throat  . A fast heartbeat  . A bad rash all over body  . Dizziness and weakness   Immunizations Administered    Name Date Dose VIS Date Route   Pfizer COVID-19 Vaccine 09/23/2019  1:46 PM 0.3 mL 05/24/2019 Intramuscular   Manufacturer: [place]   Lot: [id number]   [place]: [id number]

## 2019-10-25 LAB — EXTERNAL GENERIC LAB PROCEDURE: COLOGUARD: NEGATIVE

## 2019-10-25 LAB — COLOGUARD: COLOGUARD: NEGATIVE

## 2020-01-09 NOTE — Progress Notes (Unsigned)
Virtual Visit via Video Note The purpose of this virtual visit is to provide medical care while limiting exposure to the novel coronavirus.    Consent was obtained for video visit:  Yes.   Answered questions that patient had about telehealth interaction:  Yes.   I discussed the limitations, risks, security and privacy concerns of performing an evaluation and management service by telemedicine. I also discussed with the patient that there may be a patient responsible charge related to this service. The patient expressed understanding and agreed to proceed.  Pt location: Home Physician Location: office Name of referring provider:  No ref. provider found I connected with Frances Weber at patients initiation/request on 01/10/2020 at  3:30 PM EDT by video enabled telemedicine application and verified that I am speaking with the correct person using two identifiers. Pt MRN:  [id number] Pt DOB:  1971/01/22 Video Participants:  Frances Weber   History of Present Illness:  Frances Weber is a 49 year old female who follows up for headaches.  UPDATE: Last visit, started nortriptyline and rizatriptan.   Intensity:  *** Duration:  *** Frequency:  *** Frequency of abortive medication: *** Current NSAIDS:  Ibuprofen 800mg  (no longer taking because ineffective) Current analgesics:  none Current triptans:  rizatriptan 10mg  Current ergotamine:  none Current anti-emetic:  none Current muscle relaxants:  none Current anti-anxiolytic:  none Current sleep aide:  none Current Antihypertensive medications:  none Current Antidepressant medications:  nortriptyline 10mg  Current Anticonvulsant medications:  none Current anti-CGRP:  none Current Vitamins/Herbal/Supplements:  B12; D Current Antihistamines/Decongestants:  Flonase Other therapy:  none Hormone/birth control:  none Other medications:  phentermine  Caffeine:  Rarely drinks coffee.  No tea.  Rarely soda Diet:  Six to eight 16 oz bottles of  water daily; sometimes skips meals (breakfast) Exercise:  Not routine Depression:  no; Anxiety:  no Other pain:  no Sleep hygiene:  No.  May wake up during the night.  HISTORY: She has a history of headaches since she was 49 years old.  She was told that she had sinus headaches because she had associated nasal congestion and drainage.  Headaches have been worse since early 2020.  It is a squeezing on top of head and a mild to severe pressure and throbbing headache across her forehead.  It is associated with drowsiness, difficulty concentration, sometimes blurred vision and sensitivity to light and sound.  No associated nausea, vomiting, or unilateral numbness and weakness.  They last 30 minutes to 60 minutes.  They are occurring 3 to 4 times a day.  They have been more severe over the past 2 weeks.  No specific triggers.  Laying down in dark and sleep helps.    She presented to [facility] ED on 07/26/2019 after 3 weeks of progressively worsening headache as well as lightheadedness and left-sided chest pain.  Blood pressure at home was in the 150s/80s-90s.  CT Head showed no acute intracranial abnormality.  Small retention cyst noted in right maxillary sinus but no evidence of sinusitis.  EKG was normal.     Past NSAIDS:  Ketorolac; Mobic; naproxen Past analgesics:  Goodys; Tylenol, Excedrin Past abortive triptans:  none Past abortive ergotamine:  none Past muscle relaxants:  none Past anti-emetic:  Zofran 8mg  Past antihypertensive medications:  none Past antidepressant medications:  none Past anticonvulsant medications:  none Past anti-CGRP:  none Past vitamins/Herbal/Supplements:  none Past antihistamines/decongestants:  none Other past therapies:  none   Family history of headache:  Unknown Radio broadcast assistant at [employer]  Past Medical History: Past Medical History:  Diagnosis Date  . Environmental allergies   . Skin cancer     Medications: Outpatient  Encounter Medications as of 01/10/2020  Medication Sig  . albuterol (VENTOLIN HFA) 108 (90 Base) MCG/ACT inhaler Inhale into the lungs.  . cyanocobalamin (,VITAMIN B-12,) 1000 MCG/ML injection Inject 1 mL (1,000 mcg total) into the muscle every 30 (thirty) days.  . fluticasone (FLONASE) 50 MCG/ACT nasal spray 2 sprays by Each Nare route Two (2) times a day.  . ibuprofen (ADVIL) 800 MG tablet Take 1 tablet (800 mg total) by mouth every 8 (eight) hours as needed.  . meloxicam (MOBIC) 15 MG tablet Take 1 tablet (15 mg total) by mouth daily as needed for pain. (Patient not taking: Reported on 07/19/2019)  . nortriptyline (PAMELOR) 10 MG capsule Take 1 capsule (10 mg total) by mouth at bedtime.  . phentermine (ADIPEX-P) 37.5 MG tablet Take 1 tablet (37.5 mg total) by mouth daily before breakfast.  . rizatriptan (MAXALT) 10 MG tablet Take 1 tablet earliest onset of headache.  May repeat in 2 hours if needed.  Maximum 2 tablets in 24 hours.  . Vitamin D, Ergocalciferol, (DRISDOL) 1.25 MG (50000 UT) CAPS capsule Take 1 capsule (50,000 Units total) by mouth every 7 (seven) days.  . [DISCONTINUED] montelukast (SINGULAIR) 10 MG tablet Take 10 mg by mouth at bedtime.   No facility-administered encounter medications on file as of 01/10/2020.    Allergies: Allergies  Allergen Reactions  . Oxycodone-Acetaminophen     Family History: Family History  Problem Relation Age of Onset  . Diabetes Mother   . Hyperlipidemia Mother   . Cancer Mother   . Skin cancer Mother   . Diabetes Father   . Hypertension Father   . Breast cancer Maternal Grandmother   . Skin cancer Sister     Social History: Social History   Socioeconomic History  . Marital status: Married    Spouse name: Not on file  . Number of children: Not on file  . Years of education: Not on file  . Highest education level: Not on file  Occupational History  . Not on file  Tobacco Use  . Smoking status: Never Smoker  . Smokeless tobacco:  Never Used  Vaping Use  . Vaping Use: Never used  Substance and Sexual Activity  . Alcohol use: Yes    Comment: occ  . Drug use: No  . Sexual activity: Not Currently    Birth control/protection: None  Other Topics Concern  . Not on file  Social History Narrative   Right handed   Mobile home house   Slight caffeine   Social Determinants of Health   Financial Resource Strain:   . Difficulty of Paying Living Expenses:   Food Insecurity:   . Worried About Charity fundraiser in the Last Year:   . Arboriculturist in the Last Year:   Transportation Needs:   . Film/video editor (Medical):   Marland Kitchen Lack of Transportation (Non-Medical):   Physical Activity:   . Days of Exercise per Week:   . Minutes of Exercise per Session:   Stress:   . Feeling of Stress :   Social Connections:   . Frequency of Communication with Friends and Family:   . Frequency of Social Gatherings with Friends and Family:   . Attends Religious Services:   . Active Member of Clubs or Organizations:   .  Attends Archivist Meetings:   Marland Kitchen Marital Status:   Intimate Partner Violence:   . Fear of Current or Ex-Partner:   . Emotionally Abused:   Marland Kitchen Physically Abused:   . Sexually Abused:     Observations/Objective:   *** No acute distress.  Alert and oriented.  Speech fluent and not dysarthric.  Language intact.  Eyes orthophoric on primary gaze.  Face symmetric.  Assessment and Plan:   ***  Follow Up Instructions:    -I discussed the assessment and treatment plan with the patient. The patient was provided an opportunity to ask questions and all were answered. The patient agreed with the plan and demonstrated an understanding of the instructions.   The patient was advised to call back or seek an in-person evaluation if the symptoms worsen or if the condition fails to improve as anticipated.    Total Time spent in visit with the patient was:  ***, of which more than 50% of the time was spent in  counseling and/or coordinating care on ***.   Pt understands and agrees with the plan of care outlined.     Dudley Major, DO

## 2020-01-10 ENCOUNTER — Telehealth: Payer: BLUE CROSS/BLUE SHIELD | Admitting: Neurology

## 2020-01-10 ENCOUNTER — Other Ambulatory Visit: Payer: Self-pay

## 2020-01-17 ENCOUNTER — Other Ambulatory Visit: Payer: Self-pay

## 2020-01-17 MED ORDER — VITAMIN D (ERGOCALCIFEROL) 1.25 MG (50000 UNIT) PO CAPS: 50000.0000 [IU] | ORAL_CAPSULE | ORAL | 1 refills | Status: AC

## 2021-02-04 IMAGING — CR DG ANKLE COMPLETE 3+V*R*
3 series · 3 of 3 positions shown · non-contrast
Comparison: None.

CLINICAL DATA: Acute right ankle pain and swelling after injury 2
days ago.

EXAM:
RIGHT ANKLE - COMPLETE 3+ VIEW

[ankle ap]
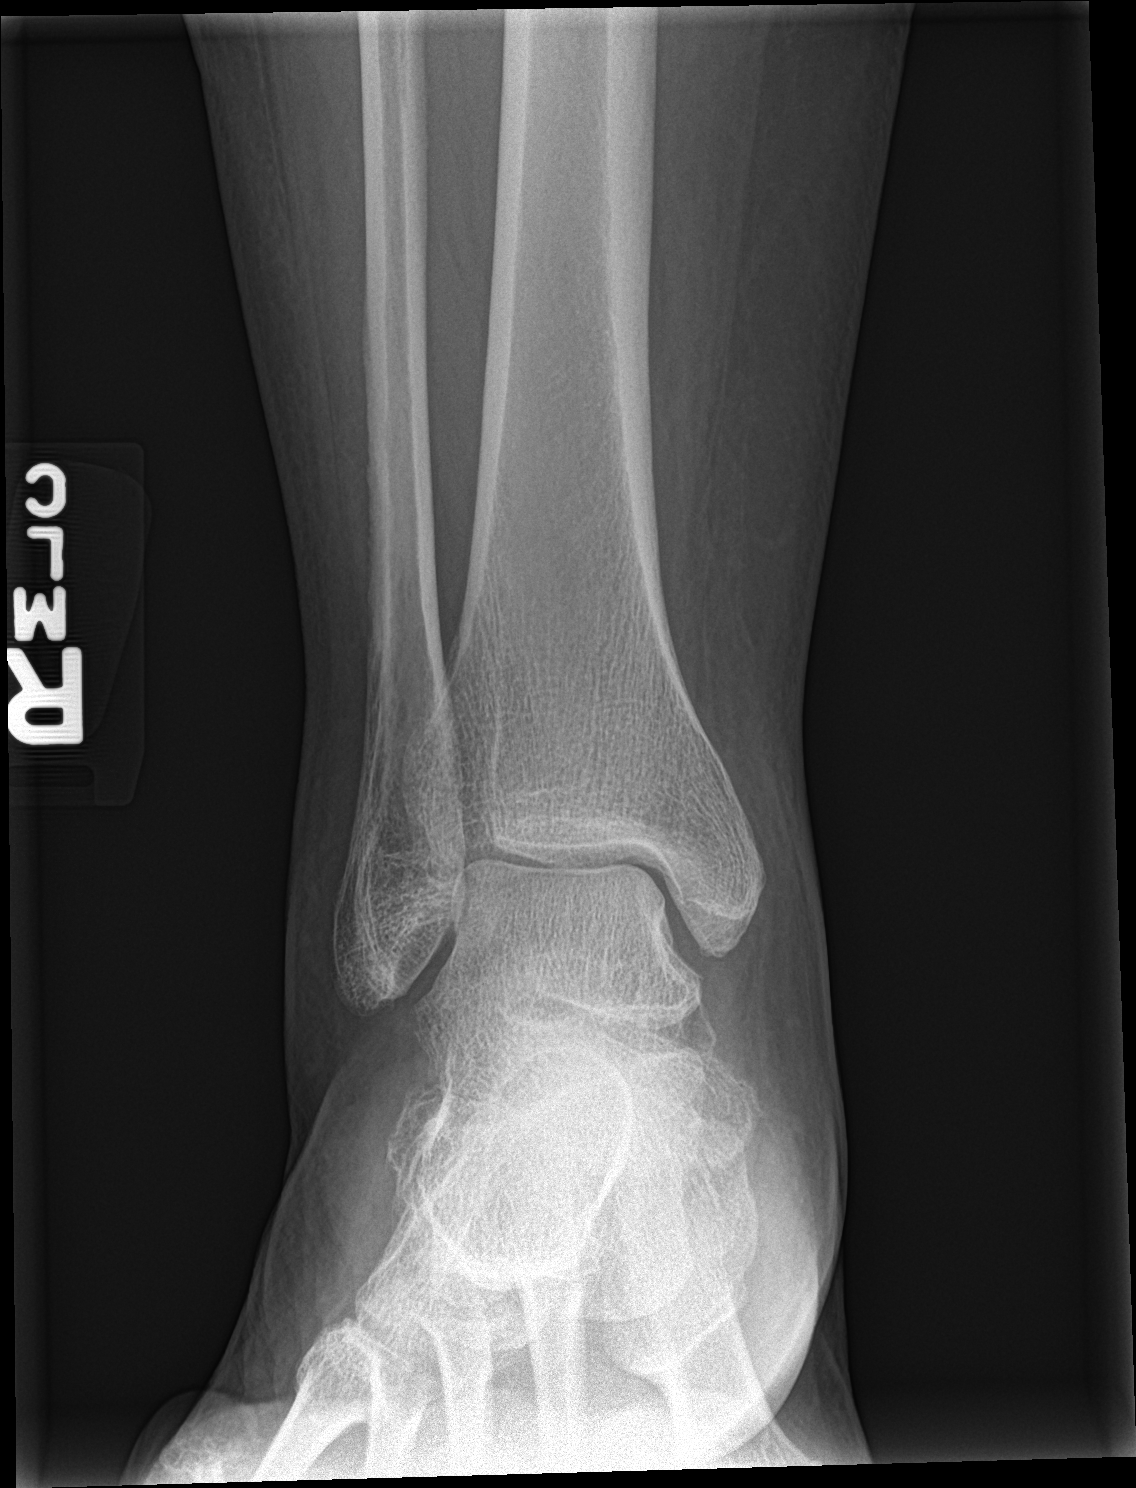

[ankle obl]
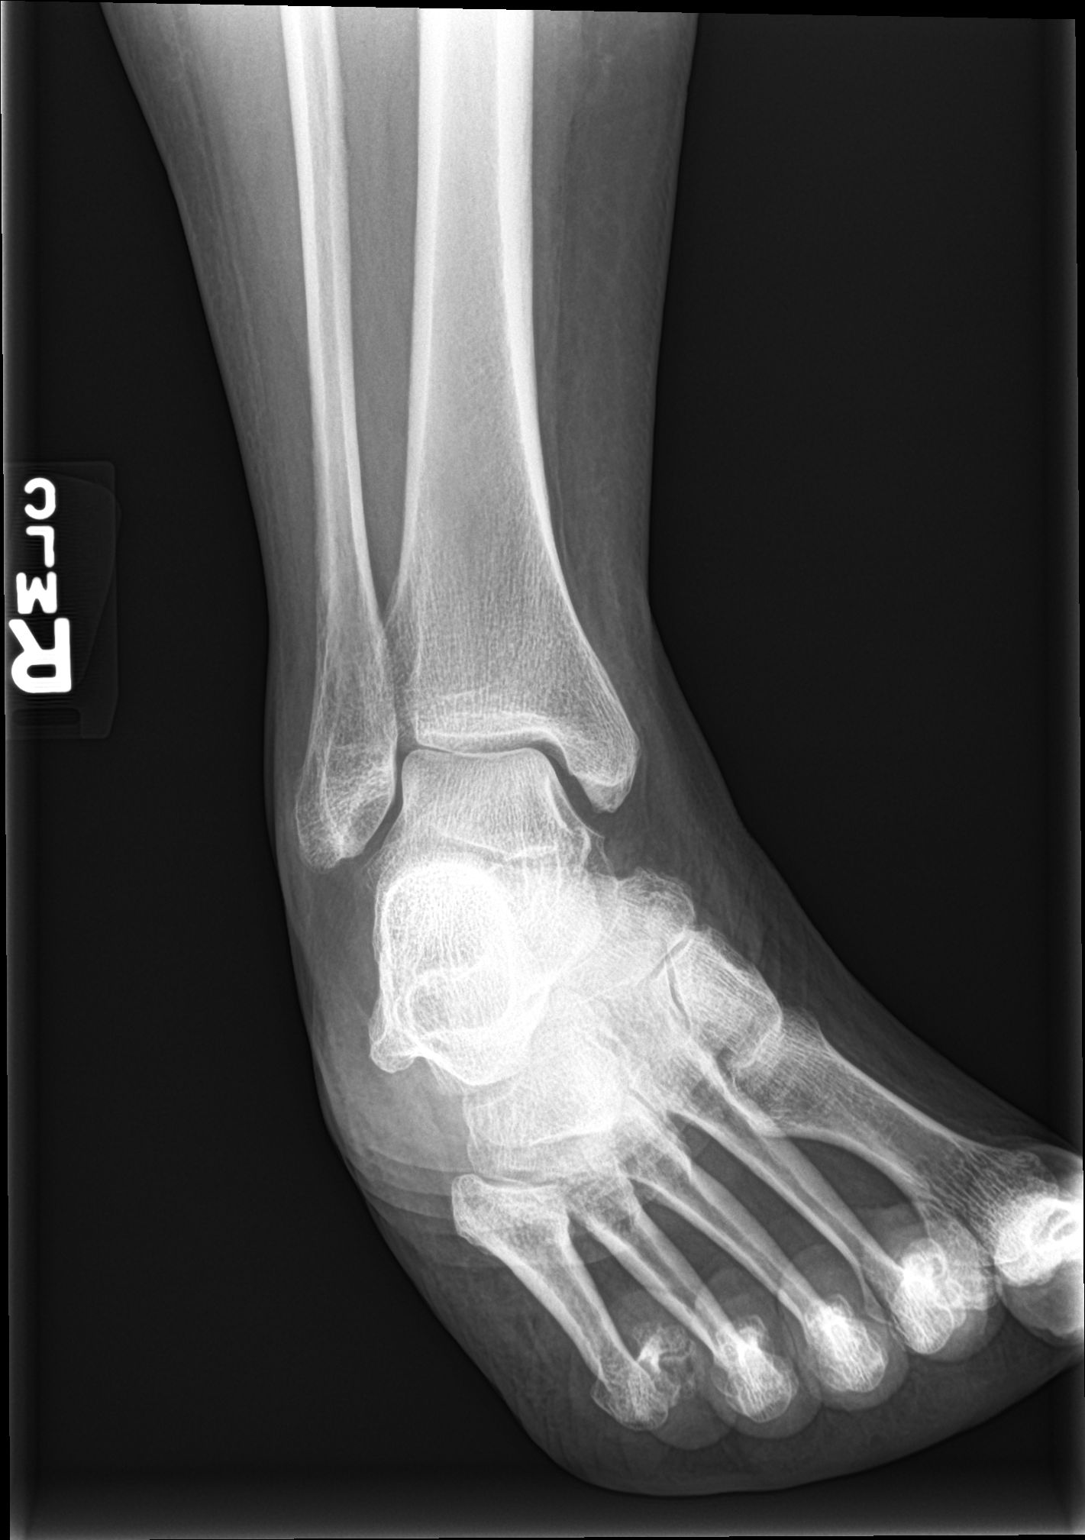

[ankle lat]
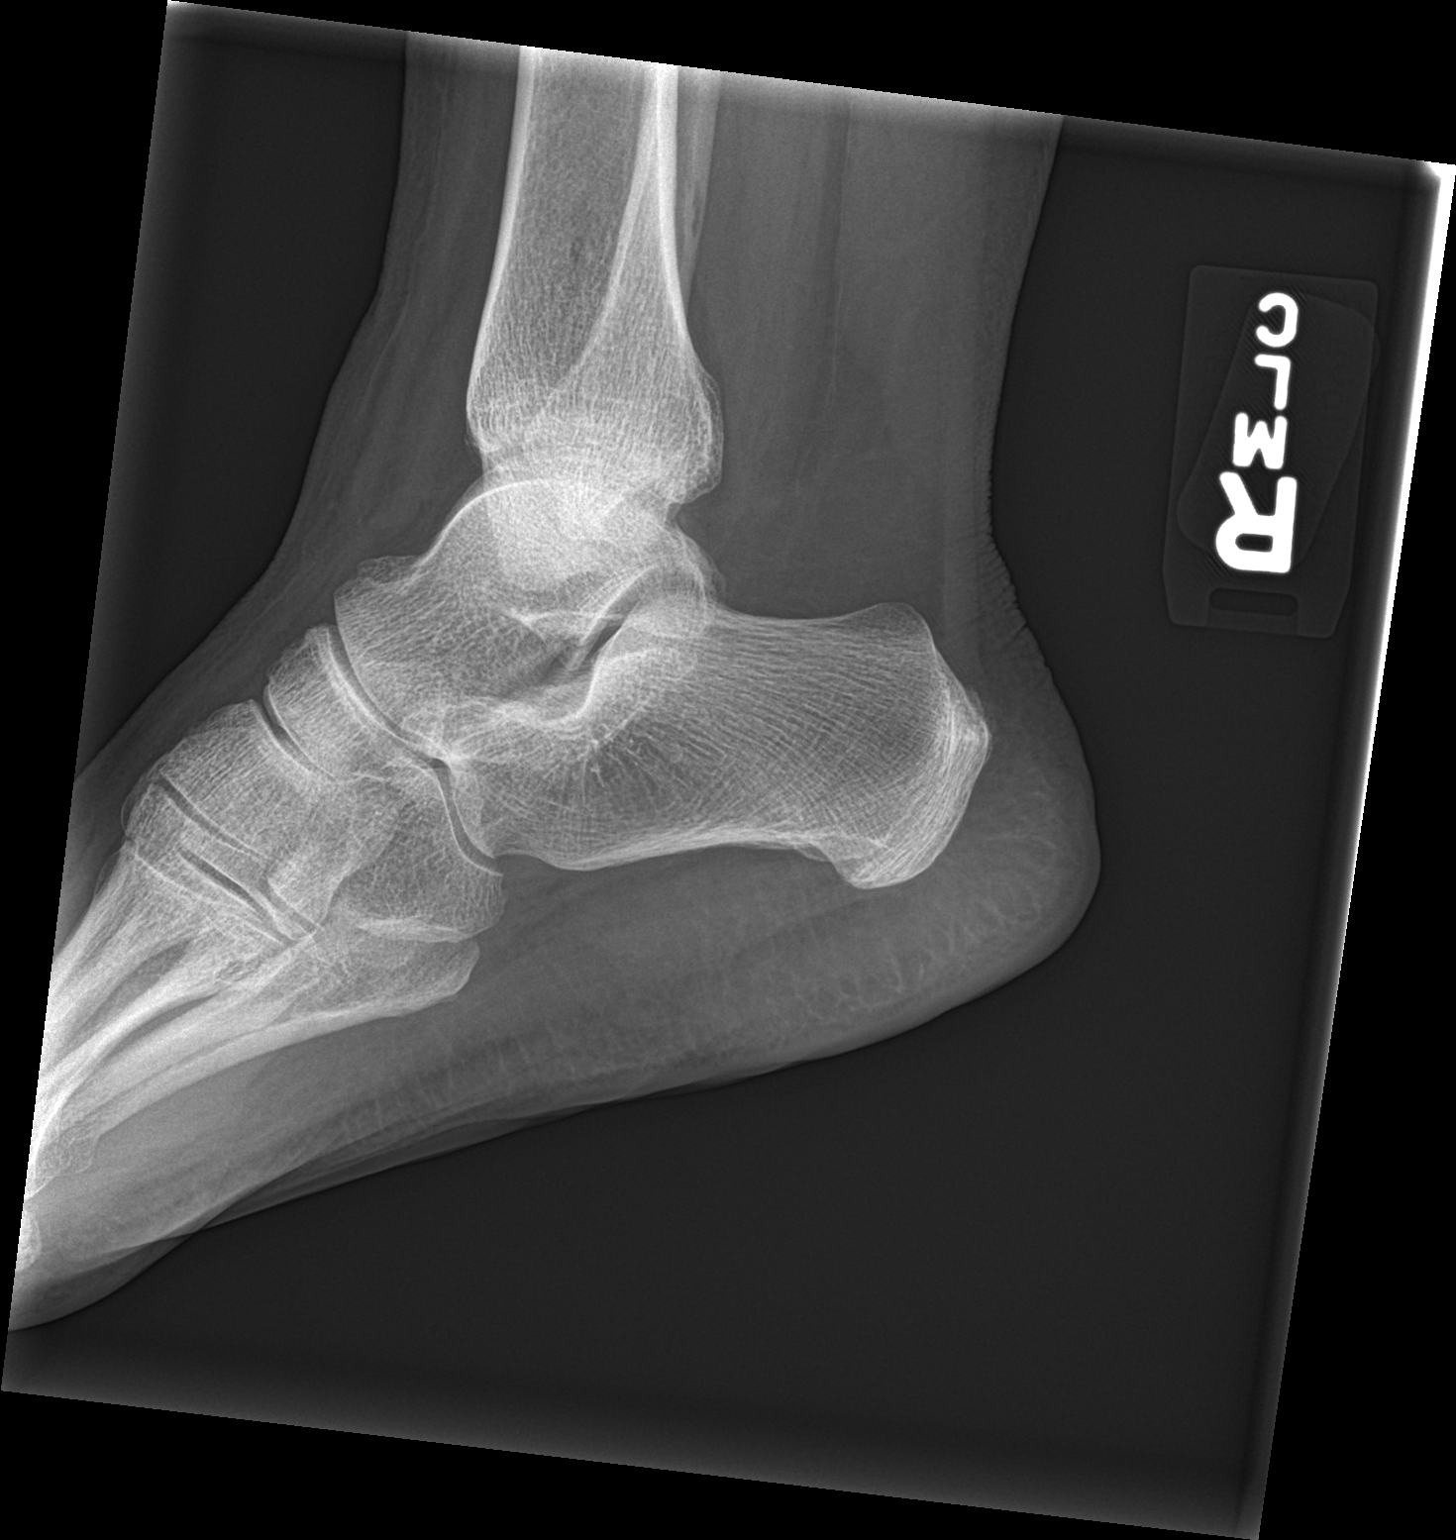

[3 of 3 positions shown; findings below may reference images not displayed]

FINDINGS: There is no evidence of fracture, dislocation, or joint effusion.
There is no evidence of arthropathy or other focal bone abnormality.
Soft tissues are unremarkable.
IMPRESSION: Negative.

## 2023-05-16 LAB — COLOGUARD: COLOGUARD: NEGATIVE

## 2023-05-16 LAB — EXTERNAL GENERIC LAB PROCEDURE: COLOGUARD: NEGATIVE

## 2023-06-19 ENCOUNTER — Ambulatory Visit
Admission: EM | Admit: 2023-06-19 | Discharge: 2023-06-19 | Disposition: A | Discharge status: Home / Self Care | Payer: 59 | Attending: Emergency Medicine | Admitting: Emergency Medicine

## 2023-06-19 ENCOUNTER — Ambulatory Visit (INDEPENDENT_AMBULATORY_CARE_PROVIDER_SITE_OTHER): Payer: 59

## 2023-06-19 ENCOUNTER — Encounter: Payer: Self-pay | Admitting: Emergency Medicine

## 2023-06-19 DIAGNOSIS — E559 Vitamin D deficiency, unspecified: Secondary | ICD-10-CM | POA: Diagnosis not present

## 2023-06-19 DIAGNOSIS — R051 Acute cough: Secondary | ICD-10-CM

## 2023-06-19 DIAGNOSIS — Z85828 Personal history of other malignant neoplasm of skin: Secondary | ICD-10-CM | POA: Diagnosis not present

## 2023-06-19 DIAGNOSIS — J309 Allergic rhinitis, unspecified: Secondary | ICD-10-CM | POA: Insufficient documentation

## 2023-06-19 DIAGNOSIS — N946 Dysmenorrhea, unspecified: Secondary | ICD-10-CM | POA: Insufficient documentation

## 2023-06-19 DIAGNOSIS — R079 Chest pain, unspecified: Secondary | ICD-10-CM | POA: Diagnosis present

## 2023-06-19 DIAGNOSIS — J069 Acute upper respiratory infection, unspecified: Secondary | ICD-10-CM

## 2023-06-19 LAB — SARS CORONAVIRUS 2 BY RT PCR: SARS Coronavirus 2 by RT PCR: NEGATIVE

## 2023-06-19 MED ORDER — ALBUTEROL SULFATE HFA 108 (90 BASE) MCG/ACT IN AERS: 1.0000 | INHALATION_SPRAY | Freq: Four times a day (QID) | RESPIRATORY_TRACT | 0 refills | Status: AC | PRN

## 2023-06-19 MED ORDER — BENZONATATE 100 MG PO CAPS: 200.0000 mg | ORAL_CAPSULE | Freq: Three times a day (TID) | ORAL | 0 refills | Status: AC

## 2023-06-19 MED ORDER — AEROCHAMBER MV MISC: 2 refills | Status: AC

## 2023-06-19 MED ORDER — PROMETHAZINE-DM 6.25-15 MG/5ML PO SYRP: 5.0000 mL | ORAL_SOLUTION | Freq: Four times a day (QID) | ORAL | 0 refills | Status: AC | PRN

## 2023-06-19 MED ORDER — IPRATROPIUM BROMIDE 0.06 % NA SOLN: 2.0000 | Freq: Four times a day (QID) | NASAL | 12 refills | Status: AC

## 2023-06-19 NOTE — ED Triage Notes (Signed)
 Pt presents with a productive cough, congestion and chest pain when she coughs x 3 days. Pt has tried Mucinex for her symptoms.

## 2023-06-19 NOTE — ED Provider Notes (Signed)
 [facility]    CSN: [id number] Arrival date & time: 06/19/23  0801      History   Chief Complaint Chief Complaint  Patient presents with   Cough    HPI Frances Weber is a 53 y.o. female.   HPI  53 year old female with a past medical history significant for skin cancer, environmental allergies, dysmenorrhea, and vitamin D  deficiency presents for evaluation of 3 days worth of respiratory symptoms that include nasal congestion, sore throat, and a productive cough.  She denies any fever, runny nose, ear pain, shortness breath, or wheezing.  She was sent in for evaluation by her employer.  Past Medical History:  Diagnosis Date   Environmental allergies    Skin cancer     Patient Active Problem List   Diagnosis Date Noted   Vitamin D  deficiency 01/29/2019   Personal history of other malignant neoplasm of skin 12/02/2013   Excessive or frequent menstruation 01/24/2013   Dysmenorrhea 01/24/2013    Past Surgical History:  Procedure Laterality Date   ABDOMINAL HYSTERECTOMY     DILITATION & CURRETTAGE/HYSTROSCOPY WITH THERMACHOICE ABLATION N/A 04/10/2013   Procedure: DILATATION & CURETTAGE/HYSTEROSCOPY WITH THERMACHOICE ABLATION;  Surgeon: Vonn VEAR Inch, MD;  Location: [facility];  Service: Gynecology;  Laterality: N/A;  D5 80ml in, 80ml out, temperture 87 degree celsious, total therapy time and 35 sec   TUBAL LIGATION      OB History     Gravida  3   Para  3   Term      Preterm      AB      Living  3      SAB      IAB      Ectopic      Multiple      Live Births               Home Medications    Prior to Admission medications   Medication Sig Start Date End Date Taking? Authorizing Provider  benzonatate  (TESSALON ) 100 MG capsule Take 2 capsules (200 mg total) by mouth every 8 (eight) hours. 06/19/23  Yes Bernardino Ditch, NP  ipratropium (ATROVENT ) 0.06 % nasal spray Place 2 sprays into both nostrils 4 (four) times daily. 06/19/23  Yes Bernardino Ditch, NP  promethazine -dextromethorphan (PROMETHAZINE -DM) 6.25-15 MG/5ML syrup Take 5 mLs by mouth 4 (four) times daily as needed. 06/19/23  Yes Bernardino Ditch, NP  Spacer/Aero-Holding Raguel (AEROCHAMBER MV) inhaler Use as instructed 06/19/23  Yes Bernardino Ditch, NP  albuterol  (VENTOLIN  HFA) 108 (90 Base) MCG/ACT inhaler Inhale 1-2 puffs into the lungs every 6 (six) hours as needed for wheezing or shortness of breath. 06/19/23   Bernardino Ditch, NP  cyanocobalamin  (,VITAMIN B-12,) 1000 MCG/ML injection Inject 1 mL (1,000 mcg total) into the muscle every 30 (thirty) days. 01/23/19   Shambley, Melody N, CNM  DOTTI 0.05 MG/24HR patch Place onto the skin.    [provider]  ferrous sulfate 325 (65 FE) MG tablet Take by mouth.    [provider]  fluticasone (FLONASE) 50 MCG/ACT nasal spray 2 sprays by Each Nare route Two (2) times a day. 06/25/14   [provider]  ibuprofen  (ADVIL ) 800 MG tablet Take 1 tablet (800 mg total) by mouth every 8 (eight) hours as needed. 07/19/19   Sebastian Sham, CNM  meloxicam  (MOBIC ) 15 MG tablet Take 1 tablet (15 mg total) by mouth daily as needed for pain. Patient not taking: Reported on 07/19/2019  04/12/19   Cook, Jayce G, DO  nortriptyline  (PAMELOR ) 10 MG capsule Take 1 capsule (10 mg total) by mouth at bedtime. 09/02/19   Skeet Juliene SAUNDERS, DO  phentermine  (ADIPEX-P ) 37.5 MG tablet Take 1 tablet (37.5 mg total) by mouth daily before breakfast. 04/12/19   Shambley, Melody N, CNM  rizatriptan  (MAXALT ) 10 MG tablet Take 1 tablet earliest onset of headache.  May repeat in 2 hours if needed.  Maximum 2 tablets in 24 hours. 09/02/19   Skeet, Adam R, DO  Vitamin D , Ergocalciferol , (DRISDOL ) 1.25 MG (50000 UNIT) CAPS capsule Take 1 capsule (50,000 Units total) by mouth every 7 (seven) days. 01/17/20   Sebastian Sham, CNM  montelukast (SINGULAIR) 10 MG tablet Take 10 mg by mouth at bedtime.  04/12/19  [provider]    Family History Family History   Problem Relation Age of Onset   Diabetes Mother    Hyperlipidemia Mother    Cancer Mother    Skin cancer Mother    Diabetes Father    Hypertension Father    Breast cancer Maternal Grandmother    Skin cancer Sister     Social History Social History   Tobacco Use   Smoking status: Never   Smokeless tobacco: Never  Vaping Use   Vaping status: Never Used  Substance Use Topics   Alcohol use: Yes    Comment: occ   Drug use: No     Allergies   Oxycodone-acetaminophen    Review of Systems Review of Systems  Constitutional:  Negative for fever.  HENT:  Positive for congestion and sore throat. Negative for ear pain and rhinorrhea.   Respiratory:  Positive for cough. Negative for shortness of breath and wheezing.      Physical Exam Triage Vital Signs ED Triage Vitals  Encounter Vitals Group     BP      Systolic BP Percentile      Diastolic BP Percentile      Pulse      Resp      Temp      Temp src      SpO2      Weight      Height      Head Circumference      Peak Flow      Pain Score      Pain Loc      Pain Education      Exclude from Growth Chart    No data found.  Updated Vital Signs BP (!) 147/93 (BP Location: Left Arm)   Pulse (!) 105   Temp 98.5 F (36.9 C) (Oral)   Resp 18   LMP 09/21/2016   SpO2 95%   Visual Acuity Right Eye Distance:   Left Eye Distance:   Bilateral Distance:    Right Eye Near:   Left Eye Near:    Bilateral Near:     Physical Exam Vitals and nursing note reviewed.  Constitutional:      Appearance: Normal appearance. She is not ill-appearing.  HENT:     Head: Normocephalic and atraumatic.     Right Ear: Tympanic membrane, ear canal and external ear normal. There is no impacted cerumen.     Left Ear: Tympanic membrane, ear canal and external ear normal. There is no impacted cerumen.     Nose: Congestion and rhinorrhea present.     Comments: Nasal mucosa is erythematous and edematous with yellow discharge in both  nares.    Mouth/Throat:  Mouth: Mucous membranes are moist.     Pharynx: Oropharynx is clear. No oropharyngeal exudate or posterior oropharyngeal erythema.  Cardiovascular:     Rate and Rhythm: Normal rate and regular rhythm.     Pulses: Normal pulses.     Heart sounds: Normal heart sounds. No murmur heard.    No friction rub. No gallop.  Pulmonary:     Effort: Pulmonary effort is normal.     Breath sounds: Normal breath sounds. No wheezing, rhonchi or rales.  Musculoskeletal:     Cervical back: Normal range of motion and neck supple. No tenderness.  Lymphadenopathy:     Cervical: No cervical adenopathy.  Skin:    General: Skin is warm and dry.     Capillary Refill: Capillary refill takes less than 2 seconds.     Findings: No rash.  Neurological:     General: No focal deficit present.     Mental Status: She is alert and oriented to person, place, and time.      UC Treatments / Results  Labs (all labs ordered are listed, but only abnormal results are displayed) Labs Reviewed  SARS CORONAVIRUS 2 BY RT PCR    EKG Normal sinus rhythm with a ventricular rate of 68 bpm PR interval 152 ms QRS duration 80 ms QT/QTc 394/418 ms No ST or T wave abnormalities noted.   Radiology DG Chest 2 View Result Date: 06/19/2023 CLINICAL DATA:  Productive cough x 3 days. EXAM: CHEST - 2 VIEW COMPARISON:  08/22/2012 FINDINGS: The heart size and mediastinal contours are within normal limits. Both lungs are clear. The visualized skeletal structures are unremarkable. IMPRESSION: No active cardiopulmonary disease. Electronically Signed   By: CHRISTELLA.  Shick M.D.   On: 06/19/2023 08:49    Procedures Procedures (including critical care time)  Medications Ordered in UC Medications - No data to display  Initial Impression / Assessment and Plan / UC Course  I have reviewed the triage vital signs and the nursing notes.  Pertinent labs & imaging results that were available during my care of the  patient were reviewed by me and considered in my medical decision making (see chart for details).   Patient is a nontoxic-appearing 53 year old female presenting for evaluation of 3 days with the respiratory symptoms as outlined HPI above.  She is able to speak in full sentence without dyspnea or tachypnea with a triage respiratory rate of 18 and an oxygen saturation of 95% on room air.  She is afebrile with an oral temp of 98.5.  Her exam does reveal inflammation of her upper respiratory tract and her lungs are clear to auscultation.  Her exam is consistent with an upper respiratory infection differential diagnosis include COVID, influenza, viral respiratory infection.  Given that she is experiencing a productive cough there is also concern for pneumonia.  She is outside the therapeutic window for antivirals for influenza but I will order a COVID PCR as well as a chest x-ray to evaluate for any acute cardiopulmonary process.  EKG shows normal sinus rhythm without any ST or T wave abnormalities.  Only other tracing available is from 2014 and is noncontributory.  Chest x-ray independently reviewed and evaluated by me.  Impression: The lung fields are well aerated and cardiomediastinal silhouette appears normal.  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  This anomaly is also noted on the lateral chest x-ray.  Similar presentation was seen on chest x-ray dated 08/22/2012.  Radiology  overread is pending. Radiology impression states no active cardiopulmonary disease.  Radiology impression of chest x-ray dated 08/22/2012 states no active cardiopulmonary disease.  COVID PCR is negative.  I will discharge patient home with a diagnosis of viral URI with a cough with prescription for Atrovent  nasal spray over the nasal congestion postnasal drip.  Also Tessalon  Perles and Promethazine  DM cough for cough and congestion.  Return precautions reviewed.  Work note provided.   Patient has also been prescribed an albuterol  inhaler in the past.  I will refill this prescription and she can use it as needed for shortness breath or wheezing, 1 to 2 puffs every 4-6 hours.   Final Clinical Impressions(s) / UC Diagnoses   Final diagnoses:  Acute cough  Viral URI with cough     Discharge Instructions      Your COVID test was negative and your chest x-ray did not show any signs of pneumonia.  I do believe you have a viral respiratory infection causing your symptoms.  Use over-the-counter Tylenol  and/or ibuprofen  according the package instructions as needed for any fever or bodyaches.  I am going to refill your albuterol  inhaler, which have been prescribed in the past, and you can use it as needed for shortness of breath or wheezing.  You may take 1 or 2 puffs every 4-6 hours as needed for shortness breath or wheezing.  Use the spacer with the inhaler.  Use the Atrovent  nasal spray, 2 squirts in each nostril every 6 hours, as needed for runny nose and postnasal drip.  Use the Tessalon  Perles every 8 hours during the day.  Take them with a small sip of water.  They may give you some numbness to the base of your tongue or a metallic taste in your mouth, this is normal.  Use the Promethazine  DM cough syrup at bedtime for cough and congestion.  It will make you drowsy so do not take it during the day.  Return for reevaluation or see your primary care provider for any new or worsening symptoms.      ED Prescriptions     Medication Sig Dispense Auth. Provider   albuterol  (VENTOLIN  HFA) 108 (90 Base) MCG/ACT inhaler Inhale 1-2 puffs into the lungs every 6 (six) hours as needed for wheezing or shortness of breath. 18 g Bernardino Ditch, NP   benzonatate  (TESSALON ) 100 MG capsule Take 2 capsules (200 mg total) by mouth every 8 (eight) hours. 21 capsule Bernardino Ditch, NP   ipratropium (ATROVENT ) 0.06 % nasal spray Place 2 sprays into both nostrils 4 (four) times daily. 15 mL  Bernardino Ditch, NP   promethazine -dextromethorphan (PROMETHAZINE -DM) 6.25-15 MG/5ML syrup Take 5 mLs by mouth 4 (four) times daily as needed. 118 mL Bernardino Ditch, NP   Spacer/Aero-Holding Raguel (AEROCHAMBER MV) inhaler Use as instructed 1 each Bernardino Ditch, NP      PDMP not reviewed this encounter.   Bernardino Ditch, NP 06/19/23 [phone]

## 2023-06-19 NOTE — Discharge Instructions (Addendum)
 Your COVID test was negative and your chest x-ray did not show any signs of pneumonia.  I do believe you have a viral respiratory infection causing your symptoms.  Use over-the-counter Tylenol  and/or ibuprofen  according the package instructions as needed for any fever or bodyaches.  I am going to refill your albuterol  inhaler, which have been prescribed in the past, and you can use it as needed for shortness of breath or wheezing.  You may take 1 or 2 puffs every 4-6 hours as needed for shortness breath or wheezing.  Use the spacer with the inhaler.  Use the Atrovent  nasal spray, 2 squirts in each nostril every 6 hours, as needed for runny nose and postnasal drip.  Use the Tessalon  Perles every 8 hours during the day.  Take them with a small sip of water.  They may give you some numbness to the base of your tongue or a metallic taste in your mouth, this is normal.  Use the Promethazine  DM cough syrup at bedtime for cough and congestion.  It will make you drowsy so do not take it during the day.  Return for reevaluation or see your primary care provider for any new or worsening symptoms.
